# Patient Record
Sex: Male | Born: 1990 | Race: Black or African American | Hispanic: No | Smoking: Current some day smoker
Health system: Southern US, Community
[De-identification: ages and names within clinical notes are randomized; demographics above are authoritative.]

## PROBLEM LIST (undated history)

## (undated) DIAGNOSIS — R002 Palpitations: Secondary | ICD-10-CM

## (undated) DIAGNOSIS — U071 COVID-19: Secondary | ICD-10-CM

## (undated) DIAGNOSIS — K589 Irritable bowel syndrome without diarrhea: Secondary | ICD-10-CM

## (undated) HISTORY — DX: Palpitations: R00.2

---

## 2013-06-11 ENCOUNTER — Emergency Department (HOSPITAL_COMMUNITY)
Admission: EM | Admit: 2013-06-11 | Discharge: 2013-06-11 | Disposition: A | Discharge status: Home / Self Care | Payer: BC Managed Care – PPO | Attending: Emergency Medicine | Admitting: Emergency Medicine

## 2013-06-11 ENCOUNTER — Encounter (HOSPITAL_COMMUNITY): Payer: Self-pay | Admitting: *Deleted

## 2013-06-11 DIAGNOSIS — K589 Irritable bowel syndrome without diarrhea: Secondary | ICD-10-CM | POA: Insufficient documentation

## 2013-06-11 DIAGNOSIS — R51 Headache: Secondary | ICD-10-CM | POA: Insufficient documentation

## 2013-06-11 DIAGNOSIS — F17200 Nicotine dependence, unspecified, uncomplicated: Secondary | ICD-10-CM | POA: Insufficient documentation

## 2013-06-11 DIAGNOSIS — R519 Headache, unspecified: Secondary | ICD-10-CM

## 2013-06-11 DIAGNOSIS — R110 Nausea: Secondary | ICD-10-CM

## 2013-06-11 HISTORY — DX: Irritable bowel syndrome, unspecified: K58.9

## 2013-06-11 MED ORDER — ONDANSETRON 4 MG PO TBDP
4.0000 mg | ORAL_TABLET | Freq: Once | ORAL | Status: AC
  Administered 2013-06-11: 4 mg via ORAL
  Filled 2013-06-11: qty 1

## 2013-06-11 MED ORDER — ONDANSETRON 4 MG PO TBDP: 4.0000 mg | ORAL_TABLET | Freq: Three times a day (TID) | ORAL | Status: DC | PRN

## 2013-06-11 NOTE — ED Notes (Signed)
Pt states that he began to have a headache around 11pm tonight; pain is to the left temple area; pt has not taken any medications for headache; pt states he has never had a headache like this before.

## 2013-06-11 NOTE — ED Provider Notes (Signed)
  CSN: [id number]     Arrival date & time 06/11/13  0251 History     First MD Initiated Contact with Patient 06/11/13 [phone]     Chief Complaint  Patient presents with  . Headache   (Consider location/radiation/quality/duration/timing/severity/associated sxs/prior Treatment) HPI Comments: Patient states he has a history of migraine headaches.  Started with one around 11:00 tonight.  He has not taken any medication because he "did not have anything."  At this time.  His headache has, resolved, but he continues to have some nausea.  He, states he normally can take an antiemetic, which his primary care physician describes for him, but he doesn't recall the name.  He also has a history of IBS, and only takes medication as needed.  He has not reported any diarrhea vomiting or blood in his stool  Patient is a 22 y.o. male presenting with headaches. The history is provided by the patient.  Headache Pain location:  Generalized Radiates to:  Does not radiate Severity currently:  Unable to specify Severity at highest:  0/10 Onset quality:  Gradual Timing:  Unable to specify Progression:  Resolved Chronicity:  Recurrent Similar to prior headaches: yes   Relieved by:  None tried Associated symptoms: nausea   Associated symptoms: no diarrhea, no dizziness and no fever     Past Medical History  Diagnosis Date  . IBS (irritable bowel syndrome)    History reviewed. No pertinent past surgical history. No family history on file. History  Substance Use Topics  . Smoking status: Current Some Day Smoker    Types: Cigars  . Smokeless tobacco: Not on file  . Alcohol Use: No    Review of Systems  Constitutional: Negative for fever.  Respiratory: Negative for shortness of breath.   Gastrointestinal: Positive for nausea. Negative for diarrhea and blood in stool.  Skin: Negative for rash.  Neurological: Positive for headaches. Negative for dizziness.  All other systems reviewed and are  negative.    Allergies  Review of patient's allergies indicates no known allergies.  Home Medications   Current Outpatient Rx  Name  Route  Sig  Dispense  Refill  . ondansetron (ZOFRAN-ODT) 4 MG disintegrating tablet   Oral   Take 1 tablet (4 mg total) by mouth every 8 (eight) hours as needed for nausea.   20 tablet   0    BP 121/72  Pulse 57  Resp 18  Ht 5\' 9"  (1.753 m)  Wt 170 lb (77.111 kg)  BMI 25.09 kg/m2  SpO2 98% Physical Exam  Vitals reviewed. Constitutional: He is oriented to person, place, and time. He appears well-developed and well-nourished.  HENT:  Head: Normocephalic and atraumatic.  Mouth/Throat: Oropharynx is clear and moist.  Eyes: Pupils are equal, round, and reactive to light.  Neck: Normal range of motion.  Cardiovascular: Normal rate and regular rhythm.   Pulmonary/Chest: Effort normal and breath sounds normal.  Abdominal: Soft. Bowel sounds are normal. He exhibits no distension. There is no tenderness.  Musculoskeletal: Normal range of motion. He exhibits no tenderness.  Lymphadenopathy:    He has no cervical adenopathy.  Neurological: He is alert and oriented to person, place, and time.  Skin: Skin is warm. No rash noted.    ED Course   Procedures (including critical care time)  Labs Reviewed - No data to display No results found. 1. Headache   2. Nausea     MDM     Arman Filter, NP 06/13/13 [phone]

## 2013-06-14 NOTE — ED Provider Notes (Signed)
Medical screening examination/treatment/procedure(s) were performed by non-physician practitioner and as supervising physician I was immediately available for consultation/collaboration.    Tarynn Garling R Subrina Vecchiarelli, MD 06/14/13 0729 

## 2013-07-04 ENCOUNTER — Emergency Department (HOSPITAL_COMMUNITY): Payer: BC Managed Care – PPO

## 2013-07-04 ENCOUNTER — Encounter (HOSPITAL_COMMUNITY): Payer: Self-pay | Admitting: *Deleted

## 2013-07-04 ENCOUNTER — Emergency Department (HOSPITAL_COMMUNITY)
Admission: EM | Admit: 2013-07-04 | Discharge: 2013-07-04 | Disposition: A | Discharge status: Home / Self Care | Payer: BC Managed Care – PPO | Attending: Emergency Medicine | Admitting: Emergency Medicine

## 2013-07-04 DIAGNOSIS — Z8719 Personal history of other diseases of the digestive system: Secondary | ICD-10-CM | POA: Insufficient documentation

## 2013-07-04 DIAGNOSIS — R0602 Shortness of breath: Secondary | ICD-10-CM | POA: Insufficient documentation

## 2013-07-04 DIAGNOSIS — R002 Palpitations: Secondary | ICD-10-CM

## 2013-07-04 DIAGNOSIS — F17200 Nicotine dependence, unspecified, uncomplicated: Secondary | ICD-10-CM | POA: Insufficient documentation

## 2013-07-04 LAB — CBC
HCT: 40.2 % (ref 39.0–52.0)
Hemoglobin: 14.7 g/dL (ref 13.0–17.0)
MCH: 30.9 pg (ref 26.0–34.0)
MCHC: 36.6 g/dL — ABNORMAL HIGH (ref 30.0–36.0)
MCV: 84.6 fL (ref 78.0–100.0)

## 2013-07-04 LAB — BASIC METABOLIC PANEL
BUN: 12 mg/dL (ref 6–23)
Creatinine, Ser: 0.87 mg/dL (ref 0.50–1.35)
GFR calc non Af Amer: >90 mL/min (ref >90)
Glucose, Bld: 87 mg/dL (ref 70–99)
Potassium: 4 mEq/L (ref 3.5–5.1)

## 2013-07-04 NOTE — ED Notes (Signed)
Pt reports having irrergular heart beat, sts his heart at times beats really fast and then it slows down, reports having shortness of breath when that happens.

## 2013-07-04 NOTE — Progress Notes (Signed)
During 07/04/13 ED visit [facility] CM noted pt with coverage but no pcp listed [facility] CM spoke with pt on how to obtain an in network pcp with insurance coverage via the customer service number or web site  CM encouraged pt and discussed pt's responsibility to verify with pt's insurance carrier that any recommended medical provider offered by any emergency room or a hospital provider is within the carrier's network. The pt voiced understanding

## 2013-07-04 NOTE — ED Provider Notes (Signed)
Medical screening examination/treatment/procedure(s) were performed by non-physician practitioner and as supervising physician I was immediately available for consultation/collaboration.    Velma Agnes J. Maddix Kliewer, MD 07/04/13 2348 

## 2013-07-04 NOTE — ED Provider Notes (Signed)
CSN: [id number]     Arrival date & time 07/04/13  1408 History   First MD Initiated Contact with Patient 07/04/13 1502     Chief Complaint  Patient presents with  . Palpitations   (Consider location/radiation/quality/duration/timing/severity/associated sxs/prior Treatment) The history is provided by the patient and medical records.   Pt presents to the ED for intermittent palpitations x several weeks.  Pt states sx seem worse when lying down in bed, better with standing upright.  Notes some SOB when palpitations occur but no chest pain, dizziness, weakness, nausea, or vomiting.  Pt has no significant personal or family cardiac hx.  Pt is an occasional smoker, cigars.  No excessive EtOH use.  No cough, fevers, sweats, or chills.  Past Medical History  Diagnosis Date  . IBS (irritable bowel syndrome)    History reviewed. No pertinent past surgical history. History reviewed. No pertinent family history. History  Substance Use Topics  . Smoking status: Current Some Day Smoker    Types: Cigars  . Smokeless tobacco: Not on file  . Alcohol Use: No    Review of Systems  Cardiovascular: Positive for palpitations.  All other systems reviewed and are negative.    Allergies  Review of patient's allergies indicates no known allergies.  Home Medications   Current Outpatient Rx  Name  Route  Sig  Dispense  Refill  . ondansetron (ZOFRAN-ODT) 4 MG disintegrating tablet   Oral   Take 1 tablet (4 mg total) by mouth every 8 (eight) hours as needed for nausea.   20 tablet   0    BP 131/72  Pulse 74  Temp(Src) 98 F (36.7 C) (Oral)  Resp 18  SpO2 99%  Physical Exam  Nursing note and vitals reviewed. Constitutional: He is oriented to person, place, and time. He appears well-developed and well-nourished.  HENT:  Head: Normocephalic and atraumatic.  Mouth/Throat: Oropharynx is clear and moist.  Eyes: Conjunctivae and EOM are normal. Pupils are equal, round, and reactive to light.   Neck: Normal range of motion. Neck supple.  Cardiovascular: Normal rate, regular rhythm, normal heart sounds and normal pulses.   No murmur heard. Pulmonary/Chest: Effort normal and breath sounds normal.  Abdominal: Soft. Bowel sounds are normal. There is no tenderness. There is no guarding.  Musculoskeletal: Normal range of motion.  Neurological: He is alert and oriented to person, place, and time. He has normal strength. He displays no tremor. No cranial nerve deficit or sensory deficit. He displays no seizure activity. Gait normal.  Skin: Skin is warm and dry.  Psychiatric: He has a normal mood and affect.    ED Course  Procedures (including critical care time)   Date: 07/04/2013  Rate: 19  Rhythm: normal sinus rhythm  QRS Axis: normal  Intervals: normal  ST/T Wave abnormalities: nonspecific ST/T changes  Conduction Disutrbances:none  Narrative Interpretation: nonspecific ST elevations  Old EKG Reviewed: none available   Labs Review Labs Reviewed  CBC - Abnormal; Notable for the following:    MCHC 36.6 (*)    All other components within normal limits  BASIC METABOLIC PANEL  POCT I-STAT TROPONIN I   Imaging Review Dg Chest Port 1 View  07/04/2013   *RADIOLOGY REPORT*  Clinical Data: Chest palpitations and shortness of breath.  PORTABLE CHEST - 1 VIEW  Comparison: None.  Findings: Lungs are clear.  Heart size is normal.  No pneumothorax or pleural fluid.  IMPRESSION: Negative chest.   Original Report Authenticated By: Holley Dexter, M.D.  MDM   1. Palpitations    EKG NSR, no acute ischemic changes but some diffuse ST elevations possibly indicative of pericarditis-- discussed with Dr. Blinda Leatherwood, likely a normal variant given absence of chest pain.  Trop negative.  CXR clear.  Labs largely WNL.  Doubt ACS, PE, dissection, or other vascular collapse at this time.  Pt afebrile, non-toxic appearing, NAD, VS stable- ok for discharge.  Pt will FU with [facility] for  further evaluation, possibly holter monitor.  Discussed plan with pt, they agreed.  Return precautions advised.  Garlon Hatchet, PA-C 07/04/13 1640

## 2013-07-13 ENCOUNTER — Ambulatory Visit (INDEPENDENT_AMBULATORY_CARE_PROVIDER_SITE_OTHER): Payer: BC Managed Care – PPO | Admitting: Cardiology

## 2013-07-13 ENCOUNTER — Encounter: Payer: Self-pay | Admitting: Cardiology

## 2013-07-13 VITALS — BP 122/82 | HR 72 | Ht 69.1 in | Wt 176.8 lb

## 2013-07-13 DIAGNOSIS — R002 Palpitations: Secondary | ICD-10-CM

## 2013-07-13 LAB — TSH: TSH: 3.33 u[IU]/mL (ref 0.35–5.50)

## 2013-07-13 NOTE — Patient Instructions (Addendum)
Lab today  ( TSH )    Schedule 48 hour holter monitor     Your physician recommends that you schedule a follow-up appointment in: 2 weeks

## 2013-07-13 NOTE — Progress Notes (Signed)
Patient ID: Rande Roylance, male   DOB: May 11, 1991, 22 y.o.   MRN: [id number]   Patient Name: Dylan Douglas Date of Encounter: 07/13/2013  Primary Care Provider:  No primary provider on file. Primary Cardiologist:  Tobias Alexander, MD  Patient Profile  Palpitations  Problem List   Past Medical History  Diagnosis Date  . IBS (irritable bowel syndrome)    No past surgical history on file.  Allergies  No Known Allergies  HPI  22 year old previously healthy male who is coming for a follow up up after an ER visit for palpitations. The symptoms started about a month ago, they usually occur after he lays down to bed, or after awakening in the morning. Sometime they wake him up at night. He describes them as rapid heart beats followed by pauses, they might lasts from few beats to hours and usually start abruptly. They are associated with shortness of breath but not chest pain. No dizziness or syncope. The patient denies use of drugs, high-energy drinks or excessive use of alcohol.  Home Medications  Prior to Admission medications   Medication Sig Start Date End Date Taking? Authorizing Provider  Hyoscyamine Sulfate (HYOSYNE PO) Take by mouth.   Yes Historical Provider, MD  ondansetron (ZOFRAN-ODT) 4 MG disintegrating tablet Take 1 tablet (4 mg total) by mouth every 8 (eight) hours as needed for nausea. 06/11/13  Yes Arman Filter, NP    Family History  No family history on file. Breast cancer - mother. No cardiac history.  Social History  History   Social History  . Marital Status: Single    Spouse Name: N/A    Number of Children: N/A  . Years of Education: N/A   Occupational History  . Not on file.   Social History Main Topics  . Smoking status: Current Some Day Smoker    Types: Cigars  . Smokeless tobacco: Not on file  . Alcohol Use: No  . Drug Use: No  . Sexual Activity: Not on file   Other Topics Concern  . Not on file   Social History Narrative  . No narrative on  file     Review of Systems General:  No chills, fever, night sweats or weight changes.  Cardiovascular:  No chest pain, dyspnea on exertion, edema, orthopnea, paroxysmal nocturnal dyspnea. Dermatological: No rash, lesions/masses Respiratory: No cough, dyspnea Urologic: No hematuria, dysuria Abdominal:   No nausea, vomiting, diarrhea, bright red blood per rectum, melena, or hematemesis Neurologic:  No visual changes, wkns, changes in mental status. All other systems reviewed and are otherwise negative except as noted above.  Physical Exam  Blood pressure 122/82, pulse 72, height 5' 9.1" (1.755 m), weight 176 lb 12.8 oz (80.196 kg).  General: Pleasant, NAD Psych: Normal affect. Neuro: Alert and oriented X 3. Moves all extremities spontaneously. HEENT: Normal  Neck: Supple without bruits or JVD. Lungs:  Resp regular and unlabored, CTA. Heart: RRR no s3, s4, or murmurs. Abdomen: Soft, non-tender, non-distended, BS + x 4.  Extremities: No clubbing, cyanosis or edema. DP/PT/Radials 2+ and equal bilaterally.  Accessory Clinical Findings  ECG - SR, HR 71, PR , QRS 80 ms, QT , normal ECG  Assessment & Plan  A 22 year old male with palpitations that appears as frequent PVCs followed by compensatory pauses. Associated shortness of breath is concerning. We will order 48-Holter monitor, order TSH and follow up in 2 weeks.     Tobias Alexander, Rexene Edison, MD 07/13/2013, 3:10 PM

## 2013-07-16 ENCOUNTER — Encounter: Payer: Self-pay | Admitting: *Deleted

## 2013-07-16 ENCOUNTER — Encounter (INDEPENDENT_AMBULATORY_CARE_PROVIDER_SITE_OTHER): Payer: BC Managed Care – PPO

## 2013-07-16 DIAGNOSIS — R002 Palpitations: Secondary | ICD-10-CM

## 2013-07-16 NOTE — Progress Notes (Signed)
Patient ID: Dylan Douglas, male   DOB: 1990/12/07, 22 y.o.   MRN: [id number] E-Cardio 48 Hour Holter Monitor applied to patient.

## 2013-07-23 ENCOUNTER — Telehealth: Payer: Self-pay

## 2013-07-23 NOTE — Telephone Encounter (Signed)
Per Dr Delton See, Pts holter monitor results were normal. According to his diary the pt had 2 episodes but was in SR both times.   Cannot reach pt with phone number provided-VM not set up. Will continue to call.

## 2013-07-24 ENCOUNTER — Ambulatory Visit: Payer: BC Managed Care – PPO | Admitting: Cardiology

## 2013-07-25 ENCOUNTER — Encounter: Payer: Self-pay | Admitting: *Deleted

## 2013-07-30 NOTE — Telephone Encounter (Signed)
**Note De-Identified Dylan Douglas Obfuscation** Cannot reach pt at phone number provided, will mail result letter to pts address.

## 2013-07-31 ENCOUNTER — Encounter: Payer: Self-pay | Admitting: Cardiology

## 2013-07-31 ENCOUNTER — Ambulatory Visit (INDEPENDENT_AMBULATORY_CARE_PROVIDER_SITE_OTHER): Payer: BC Managed Care – PPO | Admitting: Cardiology

## 2013-07-31 VITALS — BP 132/84 | HR 61 | Ht 69.1 in | Wt 183.0 lb

## 2013-07-31 DIAGNOSIS — R002 Palpitations: Secondary | ICD-10-CM

## 2013-07-31 NOTE — Progress Notes (Signed)
Patient ID: Dylan Douglas, male   DOB: [birth year], 22 y.o.   MRN: [id number]    Patient Name: Dylan Douglas Date of Encounter: 07/31/2013  Primary Care Provider:  No primary provider on file. Primary Cardiologist:  Tobias Alexander, MD   Patient Profile  Palpitation  Problem List   Past Medical History  Diagnosis Date  . IBS (irritable bowel syndrome)   . Palpitations    No past surgical history on file.  Allergies  No Known Allergies  HPI  22 year old male with palpitations associated with SOB. 48-Hour Holter showed SR only, but the patient states that he didn't have symptoms during those two days. Two more episodes since then.  Home Medications  Prior to Admission medications   Medication Sig Start Date End Date Taking? Authorizing Provider  Hyoscyamine Sulfate (HYOSYNE PO) Take by mouth.   Yes Historical Provider, MD  ondansetron (ZOFRAN-ODT) 4 MG disintegrating tablet Take 1 tablet (4 mg total) by mouth every 8 (eight) hours as needed for nausea. 06/11/13  Yes Arman Filter, NP    Review of Systems  Palpitations, with SOB, occasional chest pain.  All other systems reviewed and are otherwise negative except as noted above.  Physical Exam  Blood pressure 132/84, pulse 61, height 5' 9.1" (1.755 m), weight 183 lb (83.008 kg).  General: Pleasant, NAD Psych: Normal affect. Neuro: Alert and oriented X 3. Moves all extremities spontaneously. HEENT: Normal  Neck: Supple without bruits or JVD. Lungs:  Resp regular and unlabored, CTA. Heart: RRR no s3, s4, or murmurs. Abdomen: Soft, non-tender, non-distended, BS + x 4.  Extremities: No clubbing, cyanosis or edema. DP/PT/Radials 2+ and equal bilaterally.  Accessory Clinical Findings  TSH 3.33  Assessment & Plan  22 year old male with palpitations that were not recorded on 48Hour Holter monitor. We will order 1 month event monitor. TSH normal.  Follow up in 1 month.  Tobias Alexander, Rexene Edison, MD 07/31/2013, 9:09 AM

## 2013-07-31 NOTE — Patient Instructions (Addendum)
Schedule event monitor     Your physician recommends that you schedule a follow-up appointment in: after monitor

## 2013-08-03 ENCOUNTER — Telehealth: Payer: Self-pay | Admitting: *Deleted

## 2013-08-03 ENCOUNTER — Encounter (INDEPENDENT_AMBULATORY_CARE_PROVIDER_SITE_OTHER): Payer: BC Managed Care – PPO

## 2013-08-03 DIAGNOSIS — R002 Palpitations: Secondary | ICD-10-CM

## 2013-08-03 NOTE — Telephone Encounter (Signed)
30 day event monitor placed on Pt 08/03/13 TK

## 2013-08-15 ENCOUNTER — Encounter: Payer: Self-pay | Admitting: Cardiology

## 2013-08-15 ENCOUNTER — Ambulatory Visit (INDEPENDENT_AMBULATORY_CARE_PROVIDER_SITE_OTHER): Payer: BC Managed Care – PPO | Admitting: Cardiology

## 2013-08-15 VITALS — BP 144/86 | HR 70 | Ht 69.0 in | Wt 179.0 lb

## 2013-08-15 DIAGNOSIS — R002 Palpitations: Secondary | ICD-10-CM

## 2013-08-15 NOTE — Patient Instructions (Signed)
Your physician recommends that you schedule a follow-up appointment in: we will contact you with results of Event monitor

## 2013-08-15 NOTE — Progress Notes (Signed)
Patient ID: Dylan Douglas, male   DOB: 07-Aug-1991, 22 y.o.   MRN: [id number]  Patient Name: Dylan Douglas Date of Encounter: 08/15/2013  Primary Care Provider:  No primary provider on file. Primary Cardiologist:  Tobias Alexander, MD   Patient Profile  Palpitation  Problem List   Past Medical History  Diagnosis Date  . IBS (irritable bowel syndrome)   . Palpitations    No past surgical history on file.  Allergies  No Known Allergies  HPI  22 year old male with palpitations associated with SOB. 48-Hour Holter showed SR only, but the patient states that he didn't have symptoms during those two days. Two more episodes since then.  This is a two week follow up visit. The patient had 3 palpitations since he started wearing E-cardio monitor. He states those episodes were not as strong as some of those in the past. No chest pain or dizziness associated with these episodes.  Home Medications  Prior to Admission medications   Medication Sig Start Date End Date Taking? Authorizing Provider  Hyoscyamine Sulfate (HYOSYNE PO) Take by mouth.   Yes Historical Provider, MD  ondansetron (ZOFRAN-ODT) 4 MG disintegrating tablet Take 1 tablet (4 mg total) by mouth every 8 (eight) hours as needed for nausea. 06/11/13  Yes Arman Filter, NP    Review of Systems  Palpitations, with SOB, occasional chest pain.  All other systems reviewed and are otherwise negative except as noted above.  Physical Exam  Blood pressure 144/86, pulse 70, height 5\' 9"  (1.753 m), weight 179 lb (81.194 kg).  General: Pleasant, NAD Psych: Normal affect. Neuro: Alert and oriented X 3. Moves all extremities spontaneously. HEENT: Normal  Neck: Supple without bruits or JVD. Lungs:  Resp regular and unlabored, CTA. Heart: RRR no s3, s4, or murmurs. Abdomen: Soft, non-tender, non-distended, BS + x 4.  Extremities: No clubbing, cyanosis or edema. DP/PT/Radials 2+ and equal bilaterally.  Accessory Clinical  Findings  TSH 3.33  Assessment & Plan  22 year old male with palpitations that were not recorded on 48Hour Holter monitor. Normal TSH. We reviewed 3 episodes of palpitations that the patient initiated on his E-cardio monitor. There was normal sinus rhythm during all of them, no tachycardia, no PACs or PVCs. We will continue E-cardio monitoring to finish the entire month. We will call the patient with the results. There is no need for further intervention or therapy at this point.  Tobias Alexander, Rexene Edison, MD 08/15/2013, 1:08 PM

## 2015-04-23 ENCOUNTER — Ambulatory Visit (INDEPENDENT_AMBULATORY_CARE_PROVIDER_SITE_OTHER): Payer: BLUE CROSS/BLUE SHIELD | Admitting: Urgent Care

## 2015-04-23 ENCOUNTER — Ambulatory Visit (INDEPENDENT_AMBULATORY_CARE_PROVIDER_SITE_OTHER): Payer: BLUE CROSS/BLUE SHIELD

## 2015-04-23 VITALS — BP 118/72 | HR 67 | Temp 98.8°F | Resp 16 | Ht 68.5 in | Wt 176.0 lb

## 2015-04-23 DIAGNOSIS — S6991XA Unspecified injury of right wrist, hand and finger(s), initial encounter: Secondary | ICD-10-CM

## 2015-04-23 DIAGNOSIS — S63612A Unspecified sprain of right middle finger, initial encounter: Secondary | ICD-10-CM

## 2015-04-23 NOTE — Patient Instructions (Signed)
Finger Sprain  A finger sprain is a tear in one of the strong, fibrous tissues that connect the bones (ligaments) in your finger. The severity of the sprain depends on how much of the ligament is torn. The tear can be either partial or complete.  CAUSES   Often, sprains are a result of a fall or accident. If you extend your hands to catch an object or to protect yourself, the force of the impact causes the fibers of your ligament to stretch too much. This excess tension causes the fibers of your ligament to tear.  SYMPTOMS   You may have some loss of motion in your finger. Other symptoms include:   Bruising.   Tenderness.   Swelling.  DIAGNOSIS   In order to diagnose finger sprain, your caregiver will physically examine your finger or thumb to determine how torn the ligament is. Your caregiver may also suggest an X-ray exam of your finger to make sure no bones are broken.  TREATMENT   If your ligament is only partially torn, treatment usually involves keeping the finger in a fixed position (immobilization) for a short period. To do this, your caregiver will apply a bandage, cast, or splint to keep your finger from moving until it heals. For a partially torn ligament, the healing process usually takes 2 to 3 weeks.  If your ligament is completely torn, you may need surgery to reconnect the ligament to the bone. After surgery a cast or splint will be applied and will need to stay on your finger or thumb for 4 to 6 weeks while your ligament heals.  HOME CARE INSTRUCTIONS   Keep your injured finger elevated, when possible, to decrease swelling.   To ease pain and swelling, apply ice to your joint twice a day, for 2 to 3 days:   Put ice in a plastic bag.   Place a towel between your skin and the bag.   Leave the ice on for 15 minutes.   Only take over-the-counter or prescription medicine for pain as directed by your caregiver.   Do not wear rings on your injured finger.   Do not leave your finger unprotected  until pain and stiffness go away (usually 3 to 4 weeks).   Do not allow your cast or splint to get wet. Cover your cast or splint with a plastic bag when you shower or bathe. Do not swim.   Your caregiver may suggest special exercises for you to do during your recovery to prevent or limit permanent stiffness.  SEEK IMMEDIATE MEDICAL CARE IF:   Your cast or splint becomes damaged.   Your pain becomes worse rather than better.  MAKE SURE YOU:   Understand these instructions.   Will watch your condition.   Will get help right away if you are not doing well or get worse.  Document Released: 12/02/2004 Document Revised: 01/17/2012 Document Reviewed: 06/28/2011  Exit[employer] Patient Information 2015 [employer]. This information is not intended to replace advice given to you by your health care provider. Make sure you discuss any questions you have with your health care provider.

## 2015-04-23 NOTE — Progress Notes (Signed)
    MRN: [id number] DOB: 06-29-1991  Subjective:   Dylan Douglas is a 24 y.o. male presenting for chief complaint of Hand Pain  Reports five-day history of right middle finger injury. Patient states that he went bowling last Friday and ended up dropping the ball with his hand inside the bowling ball. He has since felt a deep pain elicited with activity including grasping and carrying things, worse at work, works at [employer]. Also admits some swelling of his right middle finger. He has been using ibuprofen daily to help with pain and has only had some relief. He denies decreased range of motion, numbness, tingling, bruising, bony deformity. Denies any other aggravating or relieving factors, no other questions or concerns.  Dylan Douglas currently has no medications in their medication list. He has No Known Allergies.  Dylan Douglas  has a past medical history of IBS (irritable bowel syndrome) and Palpitations. Also  has no past surgical history on file.  ROS As in subjective.  Objective:   Vitals: BP 118/72 mmHg  Pulse 67  Temp(Src) 98.8 F (37.1 C) (Oral)  Resp 16  Ht 5' 8.5" (1.74 m)  Wt 176 lb (79.833 kg)  BMI 26.37 kg/m2  SpO2 98%  Physical Exam  Constitutional: He is oriented to person, place, and time. He appears well-developed and well-nourished.  Cardiovascular: Normal rate.   Pulmonary/Chest: Effort normal.  Musculoskeletal:       Right hand: He exhibits swelling (trace edema at DIP of 3rd finger). He exhibits normal range of motion, no tenderness, no bony tenderness, normal capillary refill, no deformity and no laceration. Normal sensation noted. Normal strength noted.  Neurological: He is alert and oriented to person, place, and time.  Skin: Skin is warm and dry. No rash noted. No erythema. No pallor.   [facility] reading (PRIMARY) by  Dr. Milus Glazier and PA-Tiffannie Sloss. 3rd right finger: normal.  Assessment and Plan :   1. Injury of middle finger, right, initial encounter 2. Sprain of  third finger of right hand, initial encounter - Stable, recommended buddy tape system for one to 2 weeks, NSAID for pain and inflammation. Ice after work. Provided patient with temporary work restrictions. Patient is to followup as needed.  Wallis Bamberg, PA-C [facility] [phone] 04/23/2015 4:18 PM

## 2015-05-27 ENCOUNTER — Ambulatory Visit (INDEPENDENT_AMBULATORY_CARE_PROVIDER_SITE_OTHER): Payer: BLUE CROSS/BLUE SHIELD | Admitting: Emergency Medicine

## 2015-05-27 VITALS — BP 120/70 | HR 71 | Temp 98.3°F | Resp 18 | Ht 68.0 in | Wt 173.5 lb

## 2015-05-27 DIAGNOSIS — S338XXA Sprain of other parts of lumbar spine and pelvis, initial encounter: Secondary | ICD-10-CM

## 2015-05-27 DIAGNOSIS — K589 Irritable bowel syndrome without diarrhea: Secondary | ICD-10-CM

## 2015-05-27 DIAGNOSIS — S39012A Strain of muscle, fascia and tendon of lower back, initial encounter: Secondary | ICD-10-CM

## 2015-05-27 MED ORDER — NAPROXEN SODIUM 550 MG PO TABS: 550.0000 mg | ORAL_TABLET | Freq: Two times a day (BID) | ORAL | Status: DC

## 2015-05-27 MED ORDER — POLYETHYLENE GLYCOL 3350 17 GM/SCOOP PO POWD: 17.0000 g | Freq: Every day | ORAL | Status: DC

## 2015-05-27 MED ORDER — DICYCLOMINE HCL 20 MG PO TABS: 20.0000 mg | ORAL_TABLET | Freq: Four times a day (QID) | ORAL | Status: DC

## 2015-05-27 MED ORDER — CYCLOBENZAPRINE HCL 10 MG PO TABS: 10.0000 mg | ORAL_TABLET | Freq: Three times a day (TID) | ORAL | Status: DC | PRN

## 2015-05-27 NOTE — Patient Instructions (Signed)
Diet and Irritable Bowel Syndrome  No cure has been found for irritable bowel syndrome (IBS). Many options are available to treat the symptoms. Your caregiver will give you the best treatments available for your symptoms. He or she will also encourage you to manage stress and to make changes to your diet. You need to work with your caregiver and Registered Dietician to find the best combination of medicine, diet, counseling, and support to control your symptoms. The following are some diet suggestions. FOODS THAT MAKE IBS WORSE  Fatty foods, such as French fries.  Milk products, such as cheese or ice cream.  Chocolate.  Alcohol.  Caffeine (found in coffee and some sodas).  Carbonated drinks, such as soda. If certain foods cause symptoms, you should eat less of them or stop eating them. FOOD JOURNAL   Keep a journal of the foods that seem to cause distress. Write down:  What you are eating during the day and when.  What problems you are having after eating.  When the symptoms occur in relation to your meals.  What foods always make you feel badly.  Take your notes with you to your caregiver to see if you should stop eating certain foods. FOODS THAT MAKE IBS BETTER Fiber reduces IBS symptoms, especially constipation, because it makes stools soft, bulky, and easier to pass. Fiber is found in bran, bread, cereal, beans, fruit, and vegetables. Examples of foods with fiber include:  Apples.  Peaches.  Pears.  Berries.  Figs.  Broccoli, raw.  Cabbage.  Carrots.  Raw peas.  Kidney beans.  Lima beans.  Whole-grain bread.  Whole-grain cereal. Add foods with fiber to your diet a little at a time. This will let your body get used to them. Too much fiber at once might cause gas and swelling of your abdomen. This can trigger symptoms in a person with IBS. Caregivers usually recommend a diet with enough fiber to produce soft, painless bowel movements. High fiber diets may  cause gas and bloating. However, these symptoms often go away within a few weeks, as your body adjusts. In many cases, dietary fiber may lessen IBS symptoms, particularly constipation. However, it may not help pain or diarrhea. High fiber diets keep the colon mildly enlarged (distended) with the added fiber. This may help prevent spasms in the colon. Some forms of fiber also keep water in the stool, thereby preventing hard stools that are difficult to pass.  Besides telling you to eat more foods with fiber, your caregiver may also tell you to get more fiber by taking a fiber pill or drinking water mixed with a special high fiber powder. An example of this is a natural fiber laxative containing psyllium seed.  TIPS  Large meals can cause cramping and diarrhea in people with IBS. If this happens to you, try eating 4 or 5 small meals a day, or try eating less at each of your usual 3 meals. It may also help if your meals are low in fat and high in carbohydrates. Examples of carbohydrates are pasta, rice, whole-grain breads and cereals, fruits, and vegetables.  If dairy products cause your symptoms to flare up, you can try eating less of those foods. You might be able to handle yogurt better than other dairy products, because it contains bacteria that helps with digestion. Dairy products are an important source of calcium and other nutrients. If you need to avoid dairy products, be sure to talk with a Registered Dietitian about getting these nutrients   through other food sources.  Drink enough water and fluids to keep your urine clear or pale yellow. This is important, especially if you have diarrhea. FOR MORE INFORMATION  International Foundation for Functional Gastrointestinal Disorders: www.iffgd.org  National Digestive Diseases Information Clearinghouse: digestive.niddk.nih.gov Document Released: 01/15/2004 Document Revised: 01/17/2012 Document Reviewed: 01/25/2014 [employer] Patient Information 2015  [employer]. This information is not intended to replace advice given to you by your health care provider. Make sure you discuss any questions you have with your health care provider.  

## 2015-05-27 NOTE — Progress Notes (Signed)
Subjective:  Patient ID: Dylan Douglas, male    DOB: 1991-03-04  Age: 24 y.o. MRN: [id number]  CC: Abdominal Pain and Depression   HPI Dylan Douglas presents  for evaluation of abdominal pain. He has IBS by history. He is not taking any medication. He moved here from [place] and has no local physician. He's not been seen by gastroenterologist while he says. Abdominal pain since this morning. His crampy in nature and mid abdominal crosses abdomen. He has no blood mucus or pus in stool. Has no diarrhea. His IBS is primarily related to constipation. He had some nausea this morning and was unable to eat today but one meal he's had scant vomiting no fever chills  History Dylan Douglas has a past medical history of IBS (irritable bowel syndrome) and Palpitations.   He has no past surgical history on file.   His  family history includes Breast cancer in his mother; Cancer in his mother.  He   reports that he has quit smoking. His smoking use included Cigars. He does not have any smokeless tobacco history on file. He reports that he drinks alcohol. He reports that he does not use illicit drugs.  No outpatient prescriptions prior to visit.   No facility-administered medications prior to visit.    History   Social History  . Marital Status: Single    Spouse Name: N/A  . Number of Children: N/A  . Years of Education: N/A   Social History Main Topics  . Smoking status: Former Smoker    Types: Cigars  . Smokeless tobacco: Not on file  . Alcohol Use: 0.0 oz/week    0 Standard drinks or equivalent per week     Comment: Occasionally  . Drug Use: No  . Sexual Activity: Not on file   Other Topics Concern  . None   Social History Narrative     Review of Systems  Constitutional: Negative for fever, chills and appetite change.  HENT: Negative for congestion, ear pain, postnasal drip, sinus pressure and sore throat.   Eyes: Negative for pain and redness.  Respiratory: Negative for  cough, shortness of breath and wheezing.   Cardiovascular: Negative for leg swelling.  Gastrointestinal: Negative for nausea, vomiting, abdominal pain, diarrhea, constipation and blood in stool.  Endocrine: Negative for polyuria.  Genitourinary: Negative for dysuria, urgency, frequency and flank pain.  Musculoskeletal: Negative for gait problem.  Skin: Negative for rash.  Neurological: Negative for weakness and headaches.  Psychiatric/Behavioral: Negative for confusion and decreased concentration. The patient is not nervous/anxious.     Objective:  BP 120/70 mmHg  Pulse 71  Temp(Src) 98.3 F (36.8 C) (Oral)  Resp 18  Ht  (1.727 m)  Wt 173 lb 8 oz (78.699 kg)  BMI 26.39 kg/m2  SpO2 98%  Physical Exam  Constitutional: He is oriented to person, place, and time. He appears well-developed and well-nourished.  HENT:  Head: Normocephalic and atraumatic.  Eyes: Conjunctivae are normal. Pupils are equal, round, and reactive to light.  Pulmonary/Chest: Effort normal.  Abdominal: Soft. Bowel sounds are normal. He exhibits no distension. There is tenderness. There is no rebound and no guarding.  Musculoskeletal: He exhibits no edema.  Neurological: He is alert and oriented to person, place, and time.  Skin: Skin is dry.  Psychiatric: He has a normal mood and affect. His behavior is normal. Thought content normal.      Assessment & Plan:   Dylan Douglas was seen today for abdominal pain  and depression.  Diagnoses and all orders for this visit:  IBS (irritable bowel syndrome) Orders: -     Ambulatory referral to Gastroenterology  Lumbosacral strain, initial encounter  Other orders -     dicyclomine (BENTYL) 20 MG tablet; Take 1 tablet (20 mg total) by mouth every 6 (six) hours. -     polyethylene glycol powder (GLYCOLAX/MIRALAX) powder; Take 17 g by mouth daily. -     naproxen sodium (ANAPROX DS) 550 MG tablet; Take 1 tablet (550 mg total) by mouth 2 (two) times daily with a  meal. -     cyclobenzaprine (FLEXERIL) 10 MG tablet; Take 1 tablet (10 mg total) by mouth 3 (three) times daily as needed for muscle spasms.   I am having Dylan Douglas start on dicyclomine, polyethylene glycol powder, naproxen sodium, and cyclobenzaprine. I am also having him maintain his Loratadine and ondansetron.  Meds ordered this encounter  Medications  . Loratadine 10 MG CAPS    Sig: Take 10 mg by mouth daily as needed.  . ondansetron (ZOFRAN-ODT) 4 MG disintegrating tablet    Sig: Take 4 mg by mouth every 8 (eight) hours as needed for nausea or vomiting.  . dicyclomine (BENTYL) 20 MG tablet    Sig: Take 1 tablet (20 mg total) by mouth every 6 (six) hours.    Dispense:  100 tablet    Refill:  1  . polyethylene glycol powder (GLYCOLAX/MIRALAX) powder    Sig: Take 17 g by mouth daily.    Dispense:  3350 g    Refill:  1  . naproxen sodium (ANAPROX DS) 550 MG tablet    Sig: Take 1 tablet (550 mg total) by mouth 2 (two) times daily with a meal.    Dispense:  40 tablet    Refill:  0  . cyclobenzaprine (FLEXERIL) 10 MG tablet    Sig: Take 1 tablet (10 mg total) by mouth 3 (three) times daily as needed for muscle spasms.    Dispense:  45 tablet    Refill:  0   He was put on Bentyl and MiraLAX and will follow-up with a gastroenterologist who has been referred to  Appropriate red flag conditions were discussed with the patient as well as actions that should be taken.  Patient expressed his understanding.  Follow-up: Return if symptoms worsen or fail to improve.  Dylan Douglas, Mckinsey Keagle S, MD

## 2015-06-19 ENCOUNTER — Ambulatory Visit (INDEPENDENT_AMBULATORY_CARE_PROVIDER_SITE_OTHER): Payer: Self-pay | Admitting: Emergency Medicine

## 2015-06-19 VITALS — BP 118/70 | HR 73 | Temp 98.4°F | Resp 18 | Ht 69.25 in | Wt 175.0 lb

## 2015-06-19 DIAGNOSIS — K589 Irritable bowel syndrome without diarrhea: Secondary | ICD-10-CM

## 2015-06-19 MED ORDER — IMIPRAMINE HCL 25 MG PO TABS: 25.0000 mg | ORAL_TABLET | Freq: Every day | ORAL | Status: DC

## 2015-06-19 NOTE — Patient Instructions (Signed)
Diet and Irritable Bowel Syndrome  No cure has been found for irritable bowel syndrome (IBS). Many options are available to treat the symptoms. Your caregiver will give you the best treatments available for your symptoms. He or she will also encourage you to manage stress and to make changes to your diet. You need to work with your caregiver and Registered Dietician to find the best combination of medicine, diet, counseling, and support to control your symptoms. The following are some diet suggestions. FOODS THAT MAKE IBS WORSE  Fatty foods, such as French fries.  Milk products, such as cheese or ice cream.  Chocolate.  Alcohol.  Caffeine (found in coffee and some sodas).  Carbonated drinks, such as soda. If certain foods cause symptoms, you should eat less of them or stop eating them. FOOD JOURNAL   Keep a journal of the foods that seem to cause distress. Write down:  What you are eating during the day and when.  What problems you are having after eating.  When the symptoms occur in relation to your meals.  What foods always make you feel badly.  Take your notes with you to your caregiver to see if you should stop eating certain foods. FOODS THAT MAKE IBS BETTER Fiber reduces IBS symptoms, especially constipation, because it makes stools soft, bulky, and easier to pass. Fiber is found in bran, bread, cereal, beans, fruit, and vegetables. Examples of foods with fiber include:  Apples.  Peaches.  Pears.  Berries.  Figs.  Broccoli, raw.  Cabbage.  Carrots.  Raw peas.  Kidney beans.  Lima beans.  Whole-grain bread.  Whole-grain cereal. Add foods with fiber to your diet a little at a time. This will let your body get used to them. Too much fiber at once might cause gas and swelling of your abdomen. This can trigger symptoms in a person with IBS. Caregivers usually recommend a diet with enough fiber to produce soft, painless bowel movements. High fiber diets may  cause gas and bloating. However, these symptoms often go away within a few weeks, as your body adjusts. In many cases, dietary fiber may lessen IBS symptoms, particularly constipation. However, it may not help pain or diarrhea. High fiber diets keep the colon mildly enlarged (distended) with the added fiber. This may help prevent spasms in the colon. Some forms of fiber also keep water in the stool, thereby preventing hard stools that are difficult to pass.  Besides telling you to eat more foods with fiber, your caregiver may also tell you to get more fiber by taking a fiber pill or drinking water mixed with a special high fiber powder. An example of this is a natural fiber laxative containing psyllium seed.  TIPS  Large meals can cause cramping and diarrhea in people with IBS. If this happens to you, try eating 4 or 5 small meals a day, or try eating less at each of your usual 3 meals. It may also help if your meals are low in fat and high in carbohydrates. Examples of carbohydrates are pasta, rice, whole-grain breads and cereals, fruits, and vegetables.  If dairy products cause your symptoms to flare up, you can try eating less of those foods. You might be able to handle yogurt better than other dairy products, because it contains bacteria that helps with digestion. Dairy products are an important source of calcium and other nutrients. If you need to avoid dairy products, be sure to talk with a Registered Dietitian about getting these nutrients   through other food sources.  Drink enough water and fluids to keep your urine clear or pale yellow. This is important, especially if you have diarrhea. FOR MORE INFORMATION  International Foundation for Functional Gastrointestinal Disorders: www.iffgd.org  National Digestive Diseases Information Clearinghouse: digestive.niddk.nih.gov Document Released: 01/15/2004 Document Revised: 01/17/2012 Document Reviewed: 01/25/2014 [employer] Patient Information 2015  [employer]. This information is not intended to replace advice given to you by your health care provider. Make sure you discuss any questions you have with your health care provider.  

## 2015-06-19 NOTE — Progress Notes (Signed)
Subjective:  Patient ID: Dylan Douglas, male    DOB: May 21, 1991  Age: 24 y.o. MRN: [id number]  CC: Abdominal Pain   HPI Dylan Douglas presents   He has a history of irritable bowel syndrome. He was seen previously in July and put on Bentyl and  MiraLAX. For  Symptoms of constipation related to IBS. Now is coming to the office stating that he is not using hear any medication. He is now having 3 loose looser stools a day. He has no blood in stools or black stools nausea vomiting or other complaints of fever or chills. He'll have insurance in a month.  History Dylan Douglas has a past medical history of IBS (irritable bowel syndrome) and Palpitations.   He has no past surgical history on file.   His  family history includes Breast cancer in his mother; Cancer in his mother.  He   reports that he has quit smoking. His smoking use included Cigars. He does not have any smokeless tobacco history on file. He reports that he drinks alcohol. He reports that he does not use illicit drugs.  Outpatient Prescriptions Prior to Visit  Medication Sig Dispense Refill  . cyclobenzaprine (FLEXERIL) 10 MG tablet Take 1 tablet (10 mg total) by mouth 3 (three) times daily as needed for muscle spasms. 45 tablet 0  . dicyclomine (BENTYL) 20 MG tablet Take 1 tablet (20 mg total) by mouth every 6 (six) hours. 100 tablet 1  . Loratadine 10 MG CAPS Take 10 mg by mouth daily as needed.    . naproxen sodium (ANAPROX DS) 550 MG tablet Take 1 tablet (550 mg total) by mouth 2 (two) times daily with a meal. 40 tablet 0  . ondansetron (ZOFRAN-ODT) 4 MG disintegrating tablet Take 4 mg by mouth every 8 (eight) hours as needed for nausea or vomiting.    . polyethylene glycol powder (GLYCOLAX/MIRALAX) powder Take 17 g by mouth daily. (Patient not taking: Reported on 06/19/2015) 3350 g 1   No facility-administered medications prior to visit.    Social History   Social History  . Marital Status: Single    Spouse Name: N/A    . Number of Children: N/A  . Years of Education: N/A   Social History Main Topics  . Smoking status: Former Smoker    Types: Cigars  . Smokeless tobacco: None  . Alcohol Use: 0.0 oz/week    0 Standard drinks or equivalent per week     Comment: Occasionally  . Drug Use: No  . Sexual Activity: Not Asked   Other Topics Concern  . None   Social History Narrative     Review of Systems  Constitutional: Negative for fever, chills and appetite change.  HENT: Negative for congestion, ear pain, postnasal drip, sinus pressure and sore throat.   Eyes: Negative for pain and redness.  Respiratory: Negative for cough, shortness of breath and wheezing.   Cardiovascular: Negative for leg swelling.  Gastrointestinal: Negative for nausea, vomiting, abdominal pain, diarrhea, constipation and blood in stool.  Endocrine: Negative for polyuria.  Genitourinary: Negative for dysuria, urgency, frequency and flank pain.  Musculoskeletal: Negative for gait problem.  Skin: Negative for rash.  Neurological: Negative for weakness and headaches.  Psychiatric/Behavioral: Negative for confusion and decreased concentration. The patient is not nervous/anxious.     Objective:  BP 118/70 mmHg  Pulse 73  Temp(Src) 98.4 F (36.9 C) (Oral)  Resp 18  Ht 5' 9.25" (1.759 m)  Wt 175 lb (79.379  kg)  BMI 25.66 kg/m2  SpO2 98%  Physical Exam  Constitutional: He is oriented to person, place, and time. He appears well-developed and well-nourished. No distress.  HENT:  Head: Normocephalic and atraumatic.  Right Ear: External ear normal.  Left Ear: External ear normal.  Nose: Nose normal.  Eyes: Conjunctivae and EOM are normal. Pupils are equal, round, and reactive to light. No scleral icterus.  Neck: Normal range of motion. Neck supple. No tracheal deviation present.  Cardiovascular: Normal rate, regular rhythm and normal heart sounds.   Pulmonary/Chest: Effort normal. No respiratory distress. He has no  wheezes. He has no rales.  Abdominal: He exhibits no mass. There is no tenderness. There is no rebound and no guarding.  Musculoskeletal: He exhibits no edema.  Lymphadenopathy:    He has no cervical adenopathy.  Neurological: He is alert and oriented to person, place, and time. Coordination normal.  Skin: Skin is warm and dry. No rash noted.  Psychiatric: He has a normal mood and affect. His behavior is normal.      Assessment & Plan:   Dylan Douglas was seen today for abdominal pain.  Diagnoses and all orders for this visit:  IBS (irritable bowel syndrome) -     Ambulatory referral to Gastroenterology  Other orders -     imipramine (TOFRANIL) 25 MG tablet; Take 1 tablet (25 mg total) by mouth at bedtime.   I am having Dylan Douglas start on imipramine. I am also having him maintain his Loratadine, ondansetron, dicyclomine, polyethylene glycol powder, naproxen sodium, and cyclobenzaprine.  Meds ordered this encounter  Medications  . imipramine (TOFRANIL) 25 MG tablet    Sig: Take 1 tablet (25 mg total) by mouth at bedtime.    Dispense:  30 tablet    Refill:  1    Appropriate red flag conditions were discussed with the patient as well as actions that should be taken.  Patient expressed his understanding.  Follow-up: Return if symptoms worsen or fail to improve.  Carmelina Dane, MD

## 2015-07-04 ENCOUNTER — Encounter: Payer: Self-pay | Admitting: Emergency Medicine

## 2015-07-27 ENCOUNTER — Ambulatory Visit (INDEPENDENT_AMBULATORY_CARE_PROVIDER_SITE_OTHER): Payer: Self-pay | Admitting: Emergency Medicine

## 2015-07-27 VITALS — BP 126/72 | HR 103 | Temp 100.2°F | Resp 18 | Ht 69.0 in | Wt 172.0 lb

## 2015-07-27 DIAGNOSIS — J039 Acute tonsillitis, unspecified: Secondary | ICD-10-CM

## 2015-07-27 MED ORDER — PENICILLIN V POTASSIUM 500 MG PO TABS: 500.0000 mg | ORAL_TABLET | Freq: Four times a day (QID) | ORAL | Status: DC

## 2015-07-27 MED ORDER — HYDROCODONE-ACETAMINOPHEN 5-325 MG PO TABS: 1.0000 | ORAL_TABLET | ORAL | Status: DC | PRN

## 2015-07-27 NOTE — Progress Notes (Signed)
Subjective:  Patient ID: Dylan Douglas, male    DOB: 11/03/1991  Age: 24 y.o. MRN: [id number]  CC: Sore Throat; Fatigue; and Nausea   HPI Dylan Douglas presents  with a sore throat and fever for the last day and a half. He has no nausea vomiting no cough or coryza. No wheezing or shortness of breath. No rash. No improvement pain with over-the-counter medication. Has no ill contacts  History Dylan Douglas has a past medical history of IBS (irritable bowel syndrome) and Palpitations.   He has no past surgical history on file.   His  family history includes Breast cancer in his mother; Cancer in his mother.  He   reports that he has quit smoking. His smoking use included Cigars. He does not have any smokeless tobacco history on file. He reports that he drinks alcohol. He reports that he does not use illicit drugs.  Outpatient Prescriptions Prior to Visit  Medication Sig Dispense Refill  . Loratadine 10 MG CAPS Take 10 mg by mouth daily as needed.    . cyclobenzaprine (FLEXERIL) 10 MG tablet Take 1 tablet (10 mg total) by mouth 3 (three) times daily as needed for muscle spasms. (Patient not taking: Reported on 07/27/2015) 45 tablet 0  . dicyclomine (BENTYL) 20 MG tablet Take 1 tablet (20 mg total) by mouth every 6 (six) hours. (Patient not taking: Reported on 07/27/2015) 100 tablet 1  . imipramine (TOFRANIL) 25 MG tablet Take 1 tablet (25 mg total) by mouth at bedtime. (Patient not taking: Reported on 07/27/2015) 30 tablet 1  . naproxen sodium (ANAPROX DS) 550 MG tablet Take 1 tablet (550 mg total) by mouth 2 (two) times daily with a meal. (Patient not taking: Reported on 07/27/2015) 40 tablet 0  . ondansetron (ZOFRAN-ODT) 4 MG disintegrating tablet Take 4 mg by mouth every 8 (eight) hours as needed for nausea or vomiting.    . polyethylene glycol powder (GLYCOLAX/MIRALAX) powder Take 17 g by mouth daily. (Patient not taking: Reported on 06/19/2015) 3350 g 1   No facility-administered medications  prior to visit.    Social History   Social History  . Marital Status: Single    Spouse Name: N/A  . Number of Children: N/A  . Years of Education: N/A   Social History Main Topics  . Smoking status: Former Smoker    Types: Cigars  . Smokeless tobacco: None  . Alcohol Use: 0.0 oz/week    0 Standard drinks or equivalent per week     Comment: Occasionally  . Drug Use: No  . Sexual Activity: Not Asked   Other Topics Concern  . None   Social History Narrative     Review of Systems  Constitutional: Negative for fever, chills and appetite change.  HENT: Positive for sore throat. Negative for congestion, ear pain, postnasal drip and sinus pressure.   Eyes: Negative for pain and redness.  Respiratory: Negative for cough, shortness of breath and wheezing.   Cardiovascular: Negative for leg swelling.  Gastrointestinal: Negative for nausea, vomiting, abdominal pain, diarrhea, constipation and blood in stool.  Endocrine: Negative for polyuria.  Genitourinary: Negative for dysuria, urgency, frequency and flank pain.  Musculoskeletal: Negative for gait problem.  Skin: Negative for rash.  Neurological: Negative for weakness and headaches.  Psychiatric/Behavioral: Negative for confusion and decreased concentration. The patient is not nervous/anxious.     Objective:  BP 126/72 mmHg  Pulse 103  Temp(Src) 100.2 F (37.9 C) (Oral)  Resp 18  Ht   (1.753 m)  Wt 172 lb (78.019 kg)  BMI 25.39 kg/m2  SpO2 98%  Physical Exam  Constitutional: He is oriented to person, place, and time. He appears well-developed and well-nourished. No distress.  HENT:  Head: Normocephalic and atraumatic.  Right Ear: External ear normal.  Left Ear: External ear normal.  Nose: Nose normal.  Mouth/Throat: Oropharyngeal exudate present. No tonsillar abscesses.  Eyes: Conjunctivae and EOM are normal. Pupils are equal, round, and reactive to light. No scleral icterus.  Neck: Normal range of motion.  Neck supple. No tracheal deviation present.  Cardiovascular: Normal rate, regular rhythm and normal heart sounds.   Pulmonary/Chest: Effort normal. No respiratory distress. He has no wheezes. He has no rales.  Abdominal: He exhibits no mass. There is no tenderness. There is no rebound and no guarding.  Musculoskeletal: He exhibits no edema.  Lymphadenopathy:    He has no cervical adenopathy.  Neurological: He is alert and oriented to person, place, and time. Coordination normal.  Skin: Skin is warm and dry. No rash noted.  Psychiatric: He has a normal mood and affect. His behavior is normal.      Assessment & Plan:   Dylan Douglas was seen today for sore throat, fatigue and nausea.  Diagnoses and all orders for this visit:  Acute tonsillitis  Other orders -     penicillin v potassium (VEETID) 500 MG tablet; Take 1 tablet (500 mg total) by mouth 4 (four) times daily. -     HYDROcodone-acetaminophen (NORCO) 5-325 MG per tablet; Take 1-2 tablets by mouth every 4 (four) hours as needed.   I am having Dylan Douglas start on penicillin v potassium and HYDROcodone-acetaminophen. I am also having him maintain his Loratadine, ondansetron, dicyclomine, polyethylene glycol powder, naproxen sodium, cyclobenzaprine, and imipramine.  Meds ordered this encounter  Medications  . penicillin v potassium (VEETID) 500 MG tablet    Sig: Take 1 tablet (500 mg total) by mouth 4 (four) times daily.    Dispense:  40 tablet    Refill:  0  . HYDROcodone-acetaminophen (NORCO) 5-325 MG per tablet    Sig: Take 1-2 tablets by mouth every 4 (four) hours as needed.    Dispense:  30 tablet    Refill:  0    Appropriate red flag conditions were discussed with the patient as well as actions that should be taken.  Patient expressed his understanding.  Follow-up: Return if symptoms worsen or fail to improve.  Carmelina Dane, MD

## 2015-07-27 NOTE — Patient Instructions (Signed)

## 2015-08-15 ENCOUNTER — Telehealth: Payer: Self-pay | Admitting: Emergency Medicine

## 2015-08-15 NOTE — Telephone Encounter (Signed)
Patient states that his throat is still sore. He states that he does not have the money to come in for OV.

## 2015-08-18 MED ORDER — AMOXICILLIN 875 MG PO TABS
875.0000 mg | ORAL_TABLET | Freq: Two times a day (BID) | ORAL | Status: DC
Start: 2015-08-18 — End: 2016-02-07

## 2015-08-18 NOTE — Telephone Encounter (Signed)
Any suggestions for pt? 

## 2015-08-18 NOTE — Telephone Encounter (Signed)
Spoke with patient and will send a prescription for amoxicillin to cover for strep throat. Counseled on potential for risks and adverse effects. Patient is unable to come in to office because of financial burden. I recommended patient return to clinic in 4-5 days if he has no improvement with amoxicillin. Patient verbalized understanding.

## 2016-02-05 ENCOUNTER — Emergency Department (HOSPITAL_COMMUNITY): Payer: Managed Care, Other (non HMO)

## 2016-02-05 ENCOUNTER — Emergency Department (HOSPITAL_COMMUNITY)
Admission: EM | Admit: 2016-02-05 | Discharge: 2016-02-05 | Disposition: A | Discharge status: Home / Self Care | Payer: Managed Care, Other (non HMO) | Attending: Emergency Medicine | Admitting: Emergency Medicine

## 2016-02-05 ENCOUNTER — Encounter (HOSPITAL_COMMUNITY): Payer: Self-pay

## 2016-02-05 DIAGNOSIS — B9789 Other viral agents as the cause of diseases classified elsewhere: Secondary | ICD-10-CM

## 2016-02-05 DIAGNOSIS — K589 Irritable bowel syndrome without diarrhea: Secondary | ICD-10-CM | POA: Diagnosis not present

## 2016-02-05 DIAGNOSIS — R1033 Periumbilical pain: Secondary | ICD-10-CM | POA: Diagnosis not present

## 2016-02-05 DIAGNOSIS — R112 Nausea with vomiting, unspecified: Secondary | ICD-10-CM | POA: Insufficient documentation

## 2016-02-05 DIAGNOSIS — R05 Cough: Secondary | ICD-10-CM | POA: Diagnosis present

## 2016-02-05 DIAGNOSIS — R1032 Left lower quadrant pain: Secondary | ICD-10-CM | POA: Insufficient documentation

## 2016-02-05 DIAGNOSIS — Z792 Long term (current) use of antibiotics: Secondary | ICD-10-CM | POA: Diagnosis not present

## 2016-02-05 DIAGNOSIS — R197 Diarrhea, unspecified: Secondary | ICD-10-CM | POA: Insufficient documentation

## 2016-02-05 DIAGNOSIS — J069 Acute upper respiratory infection, unspecified: Secondary | ICD-10-CM

## 2016-02-05 DIAGNOSIS — Z87891 Personal history of nicotine dependence: Secondary | ICD-10-CM | POA: Diagnosis not present

## 2016-02-05 LAB — CBC WITH DIFFERENTIAL/PLATELET
Basophils Absolute: 0 10*3/uL (ref 0.0–0.1)
Basophils Relative: 0 %
EOS PCT: 1 %
Eosinophils Absolute: 0.1 10*3/uL (ref 0.0–0.7)
HEMATOCRIT: 44 % (ref 39.0–52.0)
Hemoglobin: 15.9 g/dL (ref 13.0–17.0)
Lymphocytes Relative: 13 %
Lymphs Abs: 1.6 10*3/uL (ref 0.7–4.0)
MCH: 30.6 pg (ref 26.0–34.0)
MCHC: 36.1 g/dL — AB (ref 30.0–36.0)
MCV: 84.6 fL (ref 78.0–100.0)
MONOS PCT: 8 %
Monocytes Absolute: 1 10*3/uL (ref 0.1–1.0)
NEUTROS ABS: 9.3 10*3/uL — AB (ref 1.7–7.7)
Neutrophils Relative %: 78 %
Platelets: 192 10*3/uL (ref 150–400)
RBC: 5.2 MIL/uL (ref 4.22–5.81)
RDW: 13 % (ref 11.5–15.5)
WBC: 12 10*3/uL — ABNORMAL HIGH (ref 4.0–10.5)

## 2016-02-05 LAB — BASIC METABOLIC PANEL
ANION GAP: 10 (ref 5–15)
BUN: 13 mg/dL (ref 6–20)
CALCIUM: 9.5 mg/dL (ref 8.9–10.3)
CO2: 25 mmol/L (ref 22–32)
CREATININE: 1.13 mg/dL (ref 0.61–1.24)
Chloride: 103 mmol/L (ref 101–111)
GFR calc Af Amer: >60 mL/min (ref >60)
GFR calc non Af Amer: >60 mL/min (ref >60)
GLUCOSE: 100 mg/dL — AB (ref 65–99)
Potassium: 3.8 mmol/L (ref 3.5–5.1)
Sodium: 138 mmol/L (ref 135–145)

## 2016-02-05 MED ORDER — ONDANSETRON HCL 4 MG/2ML IJ SOLN
4.0000 mg | Freq: Once | INTRAMUSCULAR | Status: AC
  Administered 2016-02-05: 4 mg via INTRAVENOUS
  Filled 2016-02-05: qty 2

## 2016-02-05 MED ORDER — ACETAMINOPHEN 325 MG PO TABS
650.0000 mg | ORAL_TABLET | Freq: Once | ORAL | Status: AC
  Administered 2016-02-05: 650 mg via ORAL

## 2016-02-05 MED ORDER — GUAIFENESIN-CODEINE 100-10 MG/5ML PO SOLN: 5.0000 mL | Freq: Four times a day (QID) | ORAL | Status: DC | PRN

## 2016-02-05 MED ORDER — ACETAMINOPHEN 325 MG PO TABS
ORAL_TABLET | ORAL | Status: AC
  Filled 2016-02-05: qty 2

## 2016-02-05 MED ORDER — SODIUM CHLORIDE 0.9 % IV BOLUS (SEPSIS)
1000.0000 mL | Freq: Once | INTRAVENOUS | Status: AC
  Administered 2016-02-05: 1000 mL via INTRAVENOUS

## 2016-02-05 MED ORDER — ONDANSETRON 4 MG PO TBDP: ORAL_TABLET | ORAL | Status: DC

## 2016-02-05 NOTE — Discharge Instructions (Signed)
Upper Respiratory Infection, Adult °Most upper respiratory infections (URIs) are a viral infection of the air passages leading to the lungs. A URI affects the nose, throat, and upper air passages. The most common type of URI is nasopharyngitis and is typically referred to as "the common cold." °URIs run their course and usually go away on their own. Most of the time, a URI does not require medical attention, but sometimes a bacterial infection in the upper airways can follow a viral infection. This is called a secondary infection. Sinus and middle ear infections are common types of secondary upper respiratory infections. °Bacterial pneumonia can also complicate a URI. A URI can worsen asthma and chronic obstructive pulmonary disease (COPD). Sometimes, these complications can require emergency medical care and may be life threatening.  °CAUSES °Almost all URIs are caused by viruses. A virus is a type of germ and can spread from one person to another.  °RISKS FACTORS °You may be at risk for a URI if:  °· You smoke.   °· You have chronic heart or lung disease. °· You have a weakened defense (immune) system.   °· You are very young or very old.   °· You have nasal allergies or asthma. °· You work in crowded or poorly ventilated areas. °· You work in health care facilities or schools. °SIGNS AND SYMPTOMS  °Symptoms typically develop 2-3 days after you come in contact with a cold virus. Most viral URIs last 7-10 days. However, viral URIs from the influenza virus (flu virus) can last 14-18 days and are typically more severe. Symptoms may include:  °· Runny or stuffy (congested) nose.   °· Sneezing.   °· Cough.   °· Sore throat.   °· Headache.   °· Fatigue.   °· Fever.   °· Loss of appetite.   °· Pain in your forehead, behind your eyes, and over your cheekbones (sinus pain). °· Muscle aches.   °DIAGNOSIS  °Your health care provider may diagnose a URI by: °· Physical exam. °· Tests to check that your symptoms are not due to  another condition such as: °· Strep throat. °· Sinusitis. °· Pneumonia. °· Asthma. °TREATMENT  °A URI goes away on its own with time. It cannot be cured with medicines, but medicines may be prescribed or recommended to relieve symptoms. Medicines may help: °· Reduce your fever. °· Reduce your cough. °· Relieve nasal congestion. °HOME CARE INSTRUCTIONS  °· Take medicines only as directed by your health care provider.   °· Gargle warm saltwater or take cough drops to comfort your throat as directed by your health care provider. °· Use a warm mist humidifier or inhale steam from a shower to increase air moisture. This may make it easier to breathe. °· Drink enough fluid to keep your urine clear or pale yellow.   °· Eat soups and other clear broths and maintain good nutrition.   °· Rest as needed.   °· Return to work when your temperature has returned to normal or as your health care provider advises. You may need to stay home longer to avoid infecting others. You can also use a face mask and careful hand washing to prevent spread of the virus. °· Increase the usage of your inhaler if you have asthma.   °· Do not use any tobacco products, including cigarettes, chewing tobacco, or electronic cigarettes. If you need help quitting, ask your health care provider. °PREVENTION  °The best way to protect yourself from getting a cold is to practice good hygiene.  °· Avoid oral or hand contact with people with cold   symptoms.   °· Wash your hands often if contact occurs.   °There is no clear evidence that vitamin C, vitamin E, echinacea, or exercise reduces the chance of developing a cold. However, it is always recommended to get plenty of rest, exercise, and practice good nutrition.  °SEEK MEDICAL CARE IF:  °· You are getting worse rather than better.   °· Your symptoms are not controlled by medicine.   °· You have chills. °· You have worsening shortness of breath. °· You have brown or red mucus. °· You have yellow or brown nasal  discharge. °· You have pain in your face, especially when you bend forward. °· You have a fever. °· You have swollen neck glands. °· You have pain while swallowing. °· You have white areas in the back of your throat. °SEEK IMMEDIATE MEDICAL CARE IF:  °· You have severe or persistent: °¨ Headache. °¨ Ear pain. °¨ Sinus pain. °¨ Chest pain. °· You have chronic lung disease and any of the following: °¨ Wheezing. °¨ Prolonged cough. °¨ Coughing up blood. °¨ A change in your usual mucus. °· You have a stiff neck. °· You have changes in your: °¨ Vision. °¨ Hearing. °¨ Thinking. °¨ Mood. °MAKE SURE YOU:  °· Understand these instructions. °· Will watch your condition. °· Will get help right away if you are not doing well or get worse. °  °This information is not intended to replace advice given to you by your health care provider. Make sure you discuss any questions you have with your health care provider. °  °Document Released: 04/20/2001 Document Revised: 03/11/2015 Document Reviewed: 01/30/2014 °Elsevier Interactive Patient Education ©2016 [employer] ° °Cough, Adult °Coughing is a reflex that clears your throat and your airways. Coughing helps to heal and protect your lungs. It is normal to cough occasionally, but a cough that happens with other symptoms or lasts a long time may be a sign of a condition that needs treatment. A cough may last only 2-3 weeks (acute), or it may last longer than 8 weeks (chronic). °CAUSES °Coughing is commonly caused by: °· Breathing in substances that irritate your lungs. °· A viral or bacterial respiratory infection. °· Allergies. °· Asthma. °· Postnasal drip. °· Smoking. °· Acid backing up from the stomach into the esophagus (gastroesophageal reflux). °· Certain medicines. °· Chronic lung problems, including COPD (or rarely, lung cancer). °· Other medical conditions such as heart failure. °HOME CARE INSTRUCTIONS  °Pay attention to any changes in your symptoms. Take these actions to  help with your discomfort: °· Take medicines only as told by your health care provider. °¨ If you were prescribed an antibiotic medicine, take it as told by your health care provider. Do not stop taking the antibiotic even if you start to feel better. °¨ Talk with your health care provider before you take a cough suppressant medicine. °· Drink enough fluid to keep your urine clear or pale yellow. °· If the air is dry, use a cold steam vaporizer or humidifier in your bedroom or your home to help loosen secretions. °· Avoid anything that causes you to cough at work or at home. °· If your cough is worse at night, try sleeping in a semi-upright position. °· Avoid cigarette smoke. If you smoke, quit smoking. If you need help quitting, ask your health care provider. °· Avoid caffeine. °· Avoid alcohol. °· Rest as needed. °SEEK MEDICAL CARE IF:  °· You have new symptoms. °· You cough up pus. °· Your cough   does not get better after 2-3 weeks, or your cough gets worse. °· You cannot control your cough with suppressant medicines and you are losing sleep. °· You develop pain that is getting worse or pain that is not controlled with pain medicines. °· You have a fever. °· You have unexplained weight loss. °· You have night sweats. °SEEK IMMEDIATE MEDICAL CARE IF: °· You cough up blood. °· You have difficulty breathing. °· Your heartbeat is very fast. °  °This information is not intended to replace advice given to you by your health care provider. Make sure you discuss any questions you have with your health care provider. °  °Document Released: 04/23/2011 Document Revised: 07/16/2015 Document Reviewed: 01/01/2015 °Elsevier Interactive Patient Education ©2016 [employer] ° °

## 2016-02-05 NOTE — ED Notes (Signed)
Patient here with cough and congestion x 2 days, body aches with same

## 2016-02-05 NOTE — ED Provider Notes (Signed)
CSN: [id number]     Arrival date & time 02/05/16  1549 History  By signing my name below, I, Dylan Douglas, attest that this documentation has been prepared under the direction and in the presence of [name], NP. Electronically Signed: Tanda Douglas, ED Scribe. 02/05/2016. 4:51 PM.   Chief Complaint  Patient presents with  . Cough  . Nasal Congestion   Patient is a 25 y.o. male presenting with cough. The history is provided by the patient. No language interpreter was used.  Cough Cough characteristics:  Productive Sputum characteristics:  Unable to specify Severity:  Moderate Onset quality:  Gradual Duration:  2 days Timing:  Constant Progression:  Unchanged Chronicity:  New Smoker: no   Context: sick contacts   Relieved by:  None tried Worsened by:  Nothing tried Ineffective treatments:  None tried Associated symptoms: fever and myalgias      HPI Comments: Dylan Douglas is a 25 y.o. male who presents to the Emergency Department complaining of gradual onset, constant, productive cough x 1-2 days. Pt also complains of congestion, body aches, diarrhea, nausea, and vomiting. He states that he has been unable to keep anything down for the past 2 days. Pt is also having abdominal pain that he states it similar to previous IBS abdominal pain. He has had recent sick contact with family members who have flu like symptoms. Denies any other associated symptoms.   Past Medical History  Diagnosis Date  . IBS (irritable bowel syndrome)   . Palpitations    History reviewed. No pertinent past surgical history. Family History  Problem Relation Age of Onset  . Breast cancer Mother   . Cancer Mother    Social History  Substance Use Topics  . Smoking status: Former Smoker    Types: Cigars  . Smokeless tobacco: None  . Alcohol Use: 0.0 oz/week    0 Standard drinks or equivalent per week     Comment: Occasionally    Review of Systems  Constitutional: Positive for fever.  HENT:  Positive for congestion.   Respiratory: Positive for cough.   Gastrointestinal: Positive for nausea, vomiting, abdominal pain and diarrhea.  Musculoskeletal: Positive for myalgias.  All other systems reviewed and are negative.  Allergies  Review of patient's allergies indicates no known allergies.  Home Medications   Prior to Admission medications   Medication Sig Start Date End Date Taking? Authorizing Provider  amoxicillin (AMOXIL) 875 MG tablet Take 1 tablet (875 mg total) by mouth 2 (two) times daily. 08/18/15   Wallis Bamberg, PA-C  cyclobenzaprine (FLEXERIL) 10 MG tablet Take 1 tablet (10 mg total) by mouth 3 (three) times daily as needed for muscle spasms. Patient not taking: Reported on 07/27/2015 05/27/15   Carmelina Dane, MD  dicyclomine (BENTYL) 20 MG tablet Take 1 tablet (20 mg total) by mouth every 6 (six) hours. Patient not taking: Reported on 07/27/2015 05/27/15   Carmelina Dane, MD  HYDROcodone-acetaminophen [facility]) 5-325 MG per tablet Take 1-2 tablets by mouth every 4 (four) hours as needed. 07/27/15   Carmelina Dane, MD  imipramine (TOFRANIL) 25 MG tablet Take 1 tablet (25 mg total) by mouth at bedtime. Patient not taking: Reported on 07/27/2015 06/19/15   Carmelina Dane, MD  Loratadine 10 MG CAPS Take 10 mg by mouth daily as needed.    Historical Provider, MD  naproxen sodium (ANAPROX DS) 550 MG tablet Take 1 tablet (550 mg total) by mouth 2 (two) times daily with a meal.  Patient not taking: Reported on 07/27/2015 05/27/15 05/26/16  Carmelina DaneJeffery S Anderson, MD  ondansetron (ZOFRAN-ODT) 4 MG disintegrating tablet Take 4 mg by mouth every 8 (eight) hours as needed for nausea or vomiting.    Historical Provider, MD  penicillin v potassium (VEETID) 500 MG tablet Take 1 tablet (500 mg total) by mouth 4 (four) times daily. 07/27/15   Carmelina DaneJeffery S Anderson, MD  polyethylene glycol powder (GLYCOLAX/MIRALAX) powder Take 17 g by mouth daily. Patient not taking: Reported on 06/19/2015  05/27/15   Carmelina DaneJeffery S Anderson, MD   BP 125/103 mmHg  Pulse 120  Temp(Src) 101.7 F (38.7 C) (Oral)  Resp 20  Ht 5\' 10"  (1.778 m)  Wt 185 lb (83.915 kg)  BMI 26.54 kg/m2  SpO2 97%   Physical Exam  Constitutional: He is oriented to person, place, and time. He appears well-developed and well-nourished. No distress.  HENT:  Head: Normocephalic and atraumatic.  Eyes: Conjunctivae and EOM are normal.  Neck: Neck supple. No tracheal deviation present.  Cardiovascular: Normal rate and regular rhythm.   Pulmonary/Chest: Effort normal and breath sounds normal. No respiratory distress. He has no wheezes. He has no rhonchi. He has no rales.  Abdominal: There is tenderness in the periumbilical area and left lower quadrant.  Musculoskeletal: Normal range of motion.  Neurological: He is alert and oriented to person, place, and time.  Skin: Skin is warm and dry.  Psychiatric: He has a normal mood and affect. His behavior is normal.  Nursing note and vitals reviewed.   ED Course  Procedures (including critical care time)  DIAGNOSTIC STUDIES: Oxygen Saturation is 97% on RA, normal by my interpretation.    COORDINATION OF CARE: 4:49 PM-Discussed treatment plan which includes CXR with pt at bedside and pt agreed to plan.   Labs Review Labs Reviewed  CBC WITH DIFFERENTIAL/PLATELET  BASIC METABOLIC PANEL    Imaging Review Dg Chest 2 View  02/05/2016  CLINICAL DATA:  Cough, nausea and vomiting EXAM: CHEST  2 VIEW COMPARISON:  07/04/2013 chest radiograph. FINDINGS: Stable cardiomediastinal silhouette with normal heart size. No pneumothorax. No pleural effusion. Lungs appear clear, with no acute consolidative airspace disease and no pulmonary edema. IMPRESSION: No active cardiopulmonary disease. Electronically Signed   By: Delbert PhenixJason A Poff M.D.   On: 02/05/2016 16:54   I have personally reviewed and evaluated these images and lab results as part of my medical decision-making.   EKG  Interpretation None     Patient feels better after IV fluids and medication. MDM   Final diagnoses:  None  Pt symptoms consistent with URI. CXR negative for acute infiltrate. Pt will be discharged with symptomatic treatment.  Discussed return precautions.  Pt is hemodynamically stable & in NAD prior to discharge.  I personally performed the services described in this documentation, which was scribed in my presence. The recorded information has been reviewed and is accurate.      Felicie Mornavid Faylene Allerton, NP 02/05/16 1917  Laurence Spatesachel Morgan Little, MD 02/06/16 [phone]

## 2016-02-06 ENCOUNTER — Encounter (HOSPITAL_COMMUNITY): Payer: Self-pay | Admitting: Emergency Medicine

## 2016-02-06 ENCOUNTER — Emergency Department (HOSPITAL_COMMUNITY)
Admission: EM | Admit: 2016-02-06 | Discharge: 2016-02-07 | Disposition: A | Discharge status: Home / Self Care | Payer: Managed Care, Other (non HMO) | Attending: Emergency Medicine | Admitting: Emergency Medicine

## 2016-02-06 DIAGNOSIS — Z87891 Personal history of nicotine dependence: Secondary | ICD-10-CM | POA: Insufficient documentation

## 2016-02-06 DIAGNOSIS — J111 Influenza due to unidentified influenza virus with other respiratory manifestations: Secondary | ICD-10-CM | POA: Diagnosis not present

## 2016-02-06 DIAGNOSIS — R69 Illness, unspecified: Secondary | ICD-10-CM

## 2016-02-06 DIAGNOSIS — E860 Dehydration: Secondary | ICD-10-CM | POA: Insufficient documentation

## 2016-02-06 DIAGNOSIS — Z8719 Personal history of other diseases of the digestive system: Secondary | ICD-10-CM | POA: Insufficient documentation

## 2016-02-06 DIAGNOSIS — Z79899 Other long term (current) drug therapy: Secondary | ICD-10-CM | POA: Insufficient documentation

## 2016-02-06 DIAGNOSIS — R05 Cough: Secondary | ICD-10-CM | POA: Diagnosis present

## 2016-02-06 MED ORDER — KETOROLAC TROMETHAMINE 30 MG/ML IJ SOLN
30.0000 mg | Freq: Once | INTRAMUSCULAR | Status: AC
  Administered 2016-02-06: 30 mg via INTRAVENOUS
  Filled 2016-02-06: qty 1

## 2016-02-06 MED ORDER — METOCLOPRAMIDE HCL 10 MG PO TABS: 10.0000 mg | ORAL_TABLET | Freq: Three times a day (TID) | ORAL | Status: DC | PRN

## 2016-02-06 MED ORDER — SODIUM CHLORIDE 0.9 % IV BOLUS (SEPSIS)
1000.0000 mL | Freq: Once | INTRAVENOUS | Status: AC
  Administered 2016-02-06: 1000 mL via INTRAVENOUS

## 2016-02-06 MED ORDER — ALBUTEROL SULFATE HFA 108 (90 BASE) MCG/ACT IN AERS
2.0000 | INHALATION_SPRAY | Freq: Once | RESPIRATORY_TRACT | Status: AC
  Administered 2016-02-06: 2 via RESPIRATORY_TRACT
  Filled 2016-02-06: qty 6.7

## 2016-02-06 MED ORDER — METOCLOPRAMIDE HCL 5 MG/ML IJ SOLN
10.0000 mg | Freq: Once | INTRAMUSCULAR | Status: AC
  Administered 2016-02-06: 10 mg via INTRAVENOUS
  Filled 2016-02-06: qty 2

## 2016-02-06 NOTE — ED Provider Notes (Signed)
CSN: [id number]     Arrival date & time 02/06/16  2043 History   First MD Initiated Contact with Patient 02/06/16 2307     Chief Complaint  Patient presents with  . Cough  . Back Pain     (Consider location/radiation/quality/duration/timing/severity/associated sxs/prior Treatment) HPI Comments: 25 year old male with a history of a upper respiratory infection which has been going on for a couple of days including coughing, body aches, fevers. He also has some nausea and vomiting and is having difficulty holding down fluids. He was seen yesterday during which time he had a chest x-ray showing no signs of pneumonia. The patient states that over the last 24 hours he has had a persistent myalgia, fever and a cough, nothing seems to make this better or worse despite taking cough medications.  He also has some back pain when he coughs and some chest discomfort when he coughs. This is persistent over the last couple of days, nothing is changed. He has not had diarrhea, he has only urinated a couple of times and states that he feels dehydrated  Patient is a 25 y.o. male presenting with cough and back pain. The history is provided by the patient.  Cough Back Pain   Past Medical History  Diagnosis Date  . IBS (irritable bowel syndrome)   . Palpitations    History reviewed. No pertinent past surgical history. Family History  Problem Relation Age of Onset  . Breast cancer Mother   . Cancer Mother    Social History  Substance Use Topics  . Smoking status: Former Smoker    Types: Cigars  . Smokeless tobacco: None  . Alcohol Use: 0.0 oz/week    0 Standard drinks or equivalent per week     Comment: Occasionally    Review of Systems  Respiratory: Positive for cough.   Musculoskeletal: Positive for back pain.  All other systems reviewed and are negative.     Allergies  Review of patient's allergies indicates no known allergies.  Home Medications   Prior to Admission medications    Medication Sig Start Date End Date Taking? Authorizing Provider  amoxicillin (AMOXIL) 875 MG tablet Take 1 tablet (875 mg total) by mouth 2 (two) times daily. 08/18/15   Wallis BambergMario Mani, PA-C  cyclobenzaprine (FLEXERIL) 10 MG tablet Take 1 tablet (10 mg total) by mouth 3 (three) times daily as needed for muscle spasms. Patient not taking: Reported on 07/27/2015 05/27/15   Carmelina DaneJeffery S Anderson, MD  dicyclomine (BENTYL) 20 MG tablet Take 1 tablet (20 mg total) by mouth every 6 (six) hours. Patient not taking: Reported on 07/27/2015 05/27/15   Carmelina DaneJeffery S Anderson, MD  guaiFENesin-codeine 100-10 MG/5ML syrup Take 5 mLs by mouth every 6 (six) hours as needed for cough. 02/05/16   Felicie Mornavid Smith, NP  HYDROcodone-acetaminophen (NORCO) 5-325 MG per tablet Take 1-2 tablets by mouth every 4 (four) hours as needed. 07/27/15   Carmelina DaneJeffery S Anderson, MD  imipramine (TOFRANIL) 25 MG tablet Take 1 tablet (25 mg total) by mouth at bedtime. Patient not taking: Reported on 07/27/2015 06/19/15   Carmelina DaneJeffery S Anderson, MD  Loratadine 10 MG CAPS Take 10 mg by mouth daily as needed.    Historical Provider, MD  metoCLOPramide (REGLAN) 10 MG tablet Take 1 tablet (10 mg total) by mouth 3 (three) times daily as needed for nausea (headache / nausea). 02/06/16   Eber HongBrian Fintan Grater, MD  naproxen sodium (ANAPROX DS) 550 MG tablet Take 1 tablet (550 mg total) by mouth 2 (  two) times daily with a meal. Patient not taking: Reported on 07/27/2015 05/27/15 05/26/16  Carmelina Dane, MD  ondansetron (ZOFRAN ODT) 4 MG disintegrating tablet  ODT q6 hours prn nausea/vomit 02/05/16   [name], NP  penicillin v potassium (VEETID) 500 MG tablet Take 1 tablet (500 mg total) by mouth 4 (four) times daily. 07/27/15   Carmelina Dane, MD  polyethylene glycol powder (GLYCOLAX/MIRALAX) powder Take 17 g by mouth daily. Patient not taking: Reported on 06/19/2015 05/27/15   Carmelina Dane, MD   BP 136/72 mmHg  Pulse 97  Temp(Src) 100 F (37.8 C) (Oral)  Resp 20   Ht  (1.753 m)  Wt 86 lb (39.009 kg)  BMI 12.69 kg/m2  SpO2 94% Physical Exam  Constitutional: He appears well-developed and well-nourished. No distress.  HENT:  Head: Normocephalic and atraumatic.  Mouth/Throat: Oropharynx is clear and moist. No oropharyngeal exudate.  Mucous members are mildly dehydrated  Eyes: Conjunctivae and EOM are normal. Pupils are equal, round, and reactive to light. Right eye exhibits no discharge. Left eye exhibits no discharge. No scleral icterus.  Neck: Normal range of motion. Neck supple. No JVD present. No thyromegaly present.  Cardiovascular: Normal rate, regular rhythm, normal heart sounds and intact distal pulses.  Exam reveals no gallop and no friction rub.   No murmur heard. No tachycardia, strong pulses, no edema  Pulmonary/Chest: Effort normal and breath sounds normal. No respiratory distress. He has no wheezes. He has no rales.  Lungs are clear to auscultation, speaks in full sentences without distress or increased work of breathing  Abdominal: Soft. Bowel sounds are normal. He exhibits no distension and no mass. There is no tenderness.  No abdominal tenderness  Musculoskeletal: Normal range of motion. He exhibits no edema or tenderness.  Lymphadenopathy:    He has no cervical adenopathy.  Neurological: He is alert. Coordination normal.  Skin: Skin is warm and dry. No rash noted. No erythema.  Psychiatric: He has a normal mood and affect. His behavior is normal.  Nursing note and vitals reviewed.   ED Course  Procedures (including critical care time) Labs Review Labs Reviewed - No data to display  Imaging Review Dg Chest 2 View  02/05/2016  CLINICAL DATA:  Cough, nausea and vomiting EXAM: CHEST  2 VIEW COMPARISON:  07/04/2013 chest radiograph. FINDINGS: Stable cardiomediastinal silhouette with normal heart size. No pneumothorax. No pleural effusion. Lungs appear clear, with no acute consolidative airspace disease and no pulmonary edema.  IMPRESSION: No active cardiopulmonary disease. Electronically Signed   By: Delbert Phenix M.D.   On: 02/05/2016 16:54   I have personally reviewed and evaluated these images and lab results as part of my medical decision-making.    MDM   Final diagnoses:  Influenza-like illness    The patient is well-appearing overall though he does appear dehydrated from this illness. It sounds like he has a flulike illness, his chest x-ray was negative, today his vital signs are unremarkable except for a borderline fever. He will be given an anti-inflammatory, a metered-dose inhaler, and antidiabetic and IV fluids. Anticipate discharge. The patient is in total agreement with the plan.  Meds given in ED:  Medications  sodium chloride 0.9 % bolus 1,000 mL (not administered)  metoCLOPramide (REGLAN) injection 10 mg (not administered)  ketorolac (TORADOL) 30 MG/ML injection 30 mg (not administered)  albuterol (PROVENTIL HFA;VENTOLIN HFA) 108 (90 Base) MCG/ACT inhaler 2 puff (not administered)    New Prescriptions   METOCLOPRAMIDE (  REGLAN) 10 MG TABLET    Take 1 tablet (10 mg total) by mouth 3 (three) times daily as needed for nausea (headache / nausea).        Eber Hong, MD 02/06/16 [phone]

## 2016-02-06 NOTE — Discharge Instructions (Signed)
#  1 use the inhaler every 4 hours as needed for coughing  #2 ibuprofen or Tylenol, alternate every 4 hours as needed for fever or back pain  #3 continue the cough medication  Please obtain all of your results from medical records or have your doctors office obtain the results - share them with your doctor - you should be seen at your doctors office in the next 2 days. Call today to arrange your follow up. Take the medications as prescribed. Please review all of the medicines and only take them if you do not have an allergy to them. Please be aware that if you are taking birth control pills, taking other prescriptions, ESPECIALLY ANTIBIOTICS may make the birth control ineffective - if this is the case, either do not engage in sexual activity or use alternative methods of birth control such as condoms until you have finished the medicine and your family doctor says it is OK to restart them. If you are on a blood thinner such as COUMADIN, be aware that any other medicine that you take may cause the coumadin to either work too much, or not enough - you should have your coumadin level rechecked in next 7 days if this is the case.  ?  It is also a possibility that you have an allergic reaction to any of the medicines that you have been prescribed - Everybody reacts differently to medications and while MOST people have no trouble with most medicines, you may have a reaction such as nausea, vomiting, rash, swelling, shortness of breath. If this is the case, please stop taking the medicine immediately and contact your physician.  ?  You should return to the ER if you develop severe or worsening symptoms.

## 2016-02-06 NOTE — ED Notes (Signed)
Pt. reports persistent dry cough with generalized back pain worse when coughing and lying on bed , denies fever or chills , respirations unlabored , seen here yesterday prescribed with antiemetic  and antitussive with no relief.

## 2016-02-07 NOTE — ED Notes (Signed)
Pt stable, ambulatory, states understanding of discharge instructions 

## 2016-05-19 ENCOUNTER — Encounter (HOSPITAL_COMMUNITY): Payer: Self-pay

## 2016-05-19 ENCOUNTER — Emergency Department (HOSPITAL_COMMUNITY)
Admission: EM | Admit: 2016-05-19 | Discharge: 2016-05-19 | Disposition: A | Discharge status: Home / Self Care | Payer: Managed Care, Other (non HMO) | Attending: Emergency Medicine | Admitting: Emergency Medicine

## 2016-05-19 DIAGNOSIS — Z79899 Other long term (current) drug therapy: Secondary | ICD-10-CM | POA: Diagnosis not present

## 2016-05-19 DIAGNOSIS — R1084 Generalized abdominal pain: Secondary | ICD-10-CM | POA: Insufficient documentation

## 2016-05-19 DIAGNOSIS — F17210 Nicotine dependence, cigarettes, uncomplicated: Secondary | ICD-10-CM | POA: Diagnosis not present

## 2016-05-19 DIAGNOSIS — R1033 Periumbilical pain: Secondary | ICD-10-CM | POA: Diagnosis present

## 2016-05-19 LAB — CBC
HCT: 44.7 % (ref 39.0–52.0)
Hemoglobin: 16 g/dL (ref 13.0–17.0)
MCH: 30.5 pg (ref 26.0–34.0)
MCHC: 35.8 g/dL (ref 30.0–36.0)
MCV: 85.3 fL (ref 78.0–100.0)
PLATELETS: 217 10*3/uL (ref 150–400)
RBC: 5.24 MIL/uL (ref 4.22–5.81)
RDW: 13.1 % (ref 11.5–15.5)
WBC: 11.6 10*3/uL — ABNORMAL HIGH (ref 4.0–10.5)

## 2016-05-19 LAB — COMPREHENSIVE METABOLIC PANEL
ALK PHOS: 42 U/L (ref 38–126)
ALT: 26 U/L (ref 17–63)
AST: 32 U/L (ref 15–41)
Albumin: 4.4 g/dL (ref 3.5–5.0)
Anion gap: 11 (ref 5–15)
BUN: 15 mg/dL (ref 6–20)
CALCIUM: 9.7 mg/dL (ref 8.9–10.3)
CO2: 26 mmol/L (ref 22–32)
CREATININE: 1.16 mg/dL (ref 0.61–1.24)
Chloride: 103 mmol/L (ref 101–111)
GLUCOSE: 115 mg/dL — AB (ref 65–99)
Potassium: 3.9 mmol/L (ref 3.5–5.1)
Sodium: 140 mmol/L (ref 135–145)
Total Bilirubin: 0.9 mg/dL (ref 0.3–1.2)
Total Protein: 6.9 g/dL (ref 6.5–8.1)

## 2016-05-19 LAB — URINALYSIS, ROUTINE W REFLEX MICROSCOPIC
GLUCOSE, UA: NEGATIVE mg/dL
HGB URINE DIPSTICK: NEGATIVE
Ketones, ur: 15 mg/dL — AB
Leukocytes, UA: NEGATIVE
Nitrite: NEGATIVE
PROTEIN: 30 mg/dL — AB
SPECIFIC GRAVITY, URINE: 1.037 — AB (ref 1.005–1.030)
pH: 7 (ref 5.0–8.0)

## 2016-05-19 LAB — URINE MICROSCOPIC-ADD ON: WBC, UA: NONE SEEN WBC/hpf (ref 0–5)

## 2016-05-19 LAB — ETHANOL: Alcohol, Ethyl (B): <5 mg/dL (ref <5)

## 2016-05-19 LAB — LIPASE, BLOOD: Lipase: 19 U/L (ref 11–51)

## 2016-05-19 MED ORDER — DICYCLOMINE HCL 20 MG PO TABS: 20.0000 mg | ORAL_TABLET | Freq: Two times a day (BID) | ORAL | Status: DC

## 2016-05-19 MED ORDER — PREDNISONE 20 MG PO TABS: 40.0000 mg | ORAL_TABLET | Freq: Every day | ORAL | Status: DC

## 2016-05-19 MED ORDER — METHYLPREDNISOLONE SODIUM SUCC 125 MG IJ SOLR
125.0000 mg | Freq: Once | INTRAMUSCULAR | Status: AC
  Administered 2016-05-19: 125 mg via INTRAVENOUS
  Filled 2016-05-19: qty 2

## 2016-05-19 MED ORDER — PROCHLORPERAZINE EDISYLATE 5 MG/ML IJ SOLN
10.0000 mg | Freq: Once | INTRAMUSCULAR | Status: AC
  Administered 2016-05-19: 10 mg via INTRAVENOUS
  Filled 2016-05-19: qty 2

## 2016-05-19 MED ORDER — KETOROLAC TROMETHAMINE 30 MG/ML IJ SOLN
10.0000 mg | Freq: Once | INTRAMUSCULAR | Status: AC
  Administered 2016-05-19: 9.9 mg via INTRAVENOUS
  Filled 2016-05-19: qty 1

## 2016-05-19 MED ORDER — ONDANSETRON HCL 4 MG/2ML IJ SOLN
4.0000 mg | Freq: Once | INTRAMUSCULAR | Status: AC
  Administered 2016-05-19: 4 mg via INTRAVENOUS
  Filled 2016-05-19: qty 2

## 2016-05-19 MED ORDER — SODIUM CHLORIDE 0.9 % IV BOLUS (SEPSIS)
1000.0000 mL | Freq: Once | INTRAVENOUS | Status: AC
  Administered 2016-05-19: 1000 mL via INTRAVENOUS

## 2016-05-19 NOTE — ED Notes (Signed)
Patient here with generalized abdominal pain with vomiting that started early am. States that he has IBS and hasn't had medications for same in a while. Loose stools with same, pale on arrival

## 2016-05-19 NOTE — ED Provider Notes (Signed)
CSN: [id number]     Arrival date & time 05/19/16  1250 History   First MD Initiated Contact with Patient 05/19/16 1322     Chief Complaint  Patient presents with  . Abdominal Pain  . Emesis    HPI  Patient presents with concern of abdominal pain. Patient has a history of IBS, no recent exacerbations. This episode of pain began within the past 12 hours, since onset has been persistent, diffuse, uncomfortable, with focal pain about the periumbilical area. Patient typically has some loose stool, and this has been persistent. However, the patient has had multiple episodes of vomiting today, persistent nausea, anorexia. No inability to tolerate by mouth intake. Patient also complains of diffuse headache. Patient has a typical combination of headache and abdominal pain during either migraine or IBS exacerbations.   Past Medical History  Diagnosis Date  . IBS (irritable bowel syndrome)   . Palpitations    History reviewed. No pertinent past surgical history. Family History  Problem Relation Age of Onset  . Breast cancer Mother   . Cancer Mother    Social History  Substance Use Topics  . Smoking status: Current Some Day Smoker    Types: Cigars, Cigarettes  . Smokeless tobacco: None  . Alcohol Use: 0.0 oz/week    0 Standard drinks or equivalent per week     Comment: Occasionally    Review of Systems  Constitutional:       Per HPI, otherwise negative  HENT:       Per HPI, otherwise negative  Respiratory:       Per HPI, otherwise negative  Cardiovascular:       Per HPI, otherwise negative  Gastrointestinal: Positive for nausea, vomiting and abdominal pain.  Endocrine:       Negative aside from HPI  Genitourinary:       Neg aside from HPI   Musculoskeletal:       Per HPI, otherwise negative  Skin: Negative.   Neurological: Negative for syncope.      Allergies  Review of patient's allergies indicates no known allergies.  Home Medications   Prior to Admission  medications   Medication Sig Start Date End Date Taking? Authorizing Provider  guaiFENesin-codeine 100-10 MG/5ML syrup Take 5 mLs by mouth every 6 (six) hours as needed for cough. 02/05/16   Felicie Mornavid Smith, NP  Loratadine 10 MG CAPS Take 10 mg by mouth daily as needed (allergies).     Historical Provider, MD  metoCLOPramide (REGLAN) 10 MG tablet Take 1 tablet (10 mg total) by mouth 3 (three) times daily as needed for nausea (headache / nausea). 02/06/16   Eber HongBrian Miller, MD  ondansetron (ZOFRAN ODT) 4 MG disintegrating tablet 4mg  ODT q6 hours prn nausea/vomit 02/05/16   Felicie Mornavid Smith, NP   BP 117/65 mmHg  Pulse 75  Temp(Src) 97.8 F (36.6 C) (Oral)  Resp 16  Ht 5\' 10"  (1.778 m)  Wt 185 lb (83.915 kg)  BMI 26.54 kg/m2  SpO2 93% Physical Exam  Constitutional: He is oriented to person, place, and time. He appears well-developed. No distress.  HENT:  Head: Normocephalic and atraumatic.  Eyes: Conjunctivae and EOM are normal.  Cardiovascular: Normal rate and regular rhythm.   Pulmonary/Chest: Effort normal. No stridor. No respiratory distress.  Abdominal: He exhibits no distension.    Musculoskeletal: He exhibits no edema.  Neurological: He is alert and oriented to person, place, and time. He displays no atrophy and no tremor. He exhibits normal muscle tone. He  displays no seizure activity. Coordination normal.  Skin: Skin is warm and dry.  Psychiatric: He has a normal mood and affect.  Nursing note and vitals reviewed.   ED Course  Procedures (including critical care time) Labs Review Labs Reviewed  COMPREHENSIVE METABOLIC PANEL - Abnormal; Notable for the following:    Glucose, Bld 115 (*)    All other components within normal limits  CBC - Abnormal; Notable for the following:    WBC 11.6 (*)    All other components within normal limits  URINALYSIS, ROUTINE W REFLEX MICROSCOPIC (NOT AT [facility]) - Abnormal; Notable for the following:    Color, Urine AMBER (*)    Specific Gravity, Urine  1.037 (*)    Bilirubin Urine SMALL (*)    Ketones, ur 15 (*)    Protein, ur 30 (*)    All other components within normal limits  URINE MICROSCOPIC-ADD ON - Abnormal; Notable for the following:    Squamous Epithelial / LPF 0-5 (*)    Bacteria, UA FEW (*)    All other components within normal limits  LIPASE, BLOOD  ETHANOL    Imaging Review No results found. I have personally reviewed and evaluated these images and lab results as part of my medical decision-making.   3:52 PM Symptoms resolved, patient states that he feels better.  MDM  Young male with IBS presents with ongoing abdominal pain, nausea, vomiting. Patient does have periumbilical pain, but his pain, nausea resolved, and absent fever, leukocytosis, there is low suspicion for acute new processes. Symptoms maybe secondary to IBS flare. Patient does not have local GI follow-up, was provided referral to clinics. Patient also started on a short course of steroids, analgesia  Gerhard Munch, MD 05/19/16 1553

## 2016-05-19 NOTE — ED Notes (Addendum)
Unable to provide urine specimen at triage 

## 2016-05-19 NOTE — Discharge Instructions (Signed)
As discussed, today's evaluation was largely reassuring. However, the [place] take all medication as directed, and be sure to schedule follow-up with one of our local gastroenterology clinics.  Return here for concerning changes in your condition.

## 2017-04-12 ENCOUNTER — Emergency Department (HOSPITAL_COMMUNITY)
Admission: EM | Admit: 2017-04-12 | Discharge: 2017-04-12 | Disposition: A | Discharge status: Home / Self Care | Payer: Managed Care, Other (non HMO) | Attending: Emergency Medicine | Admitting: Emergency Medicine

## 2017-04-12 ENCOUNTER — Encounter (HOSPITAL_COMMUNITY): Payer: Self-pay | Admitting: *Deleted

## 2017-04-12 DIAGNOSIS — Z79899 Other long term (current) drug therapy: Secondary | ICD-10-CM | POA: Insufficient documentation

## 2017-04-12 DIAGNOSIS — R21 Rash and other nonspecific skin eruption: Secondary | ICD-10-CM | POA: Diagnosis present

## 2017-04-12 DIAGNOSIS — F17210 Nicotine dependence, cigarettes, uncomplicated: Secondary | ICD-10-CM | POA: Diagnosis not present

## 2017-04-12 NOTE — ED Triage Notes (Signed)
C/O rash on the back of his neck and chest onset yest. Denies any new products

## 2017-04-12 NOTE — ED Provider Notes (Signed)
[facility]-EMERGENCY DEPT Provider Note   CSN: [id number] Arrival date & time: 04/12/17  0456     History   Chief Complaint Chief Complaint  Patient presents with  . Rash    HPI Dylan Douglas is a 26 y.o. male.  The history is provided by the patient and a significant other.  Rash   This is a chronic problem. The current episode started more than 1 week ago. The problem has been gradually worsening. The problem is associated with nothing. There has been no fever. Affected Location: back/chest. The pain is mild. The pain has been constant since onset. Pertinent negatives include no blisters, no itching and no pain.    Past Medical History:  Diagnosis Date  . IBS (irritable bowel syndrome)   . Palpitations     Patient Active Problem List   Diagnosis Date Noted  . IBS (irritable bowel syndrome) 05/27/2015  . Palpitations 07/31/2013    History reviewed. No pertinent surgical history.     Home Medications    Prior to Admission medications   Medication Sig Start Date End Date Taking? Authorizing Provider  dicyclomine (BENTYL) 20 MG tablet Take 1 tablet (20 mg total) by mouth 2 (two) times daily. 05/19/16   Gerhard Munch, MD  Loratadine 10 MG CAPS Take 10 mg by mouth daily as needed (allergies).     [provider]  metoCLOPramide (REGLAN) 10 MG tablet Take 1 tablet (10 mg total) by mouth 3 (three) times daily as needed for nausea (headache / nausea). 02/06/16   Eber Hong, MD  ondansetron (ZOFRAN ODT) 4 MG disintegrating tablet 4mg  ODT q6 hours prn nausea/vomit 02/05/16   [name], NP  predniSONE (DELTASONE) 20 MG tablet Take 2 tablets (40 mg total) by mouth daily with breakfast. For the next four days 05/19/16   Gerhard Munch, MD    Family History Family History  Problem Relation Age of Onset  . Breast cancer Mother   . Cancer Mother     Social History Social History  Substance Use Topics  . Smoking status: Current Some Day Smoker    Types: Cigars,  Cigarettes  . Smokeless tobacco: Never Used  . Alcohol use 0.0 oz/week     Comment: Occasionally     Allergies   Patient has no known allergies.   Review of Systems Review of Systems  Constitutional: Negative for fever.  Skin: Positive for rash. Negative for itching.     Physical Exam Updated Vital Signs BP 128/82 (BP Location: Left Arm)   Pulse 65   Temp 98.6 F (37 C) (Oral)   Resp 16   SpO2 97%   Physical Exam CONSTITUTIONAL: Well developed/well nourished HEAD: Normocephalic/atraumatic ENMT: Mucous membranes moist, no angioedema NECK: supple no meningeal signs ABDOMEN: soft NEURO: Pt is awake/alert/appropriate, moves all extremitiesx4.  EXTREMITIES: pulses normal/equal, full ROM SKIN: warm, color normal, macular rash to chest/back No urticaria noted.   PSYCH: no abnormalities of mood noted, alert and oriented to situation   ED Treatments / Results  Labs (all labs ordered are listed, but only abnormal results are displayed) Labs Reviewed - No data to display  EKG  EKG Interpretation None       Radiology No results found.  Procedures Procedures (including critical care time)  Medications Ordered in ED Medications - No data to display   Initial Impression / Assessment and Plan / ED Course  I have reviewed the triage vital signs and the nursing notes.   Referred to dermatology  as this has been progressing for "awhile" No signs of emergent cause at this time Advised sunscreen  Final Clinical Impressions(s) / ED Diagnoses   Final diagnoses:  Rash    New Prescriptions New Prescriptions   No medications on file     Zadie RhineWickline, Gisel Vipond, MD 04/12/17 [phone]

## 2017-04-24 ENCOUNTER — Emergency Department (HOSPITAL_COMMUNITY)
Admission: EM | Admit: 2017-04-24 | Discharge: 2017-04-24 | Disposition: A | Discharge status: Home / Self Care | Payer: Managed Care, Other (non HMO) | Attending: Emergency Medicine | Admitting: Emergency Medicine

## 2017-04-24 ENCOUNTER — Encounter (HOSPITAL_COMMUNITY): Payer: Self-pay | Admitting: Emergency Medicine

## 2017-04-24 DIAGNOSIS — Z79899 Other long term (current) drug therapy: Secondary | ICD-10-CM | POA: Insufficient documentation

## 2017-04-24 DIAGNOSIS — J029 Acute pharyngitis, unspecified: Secondary | ICD-10-CM | POA: Diagnosis not present

## 2017-04-24 DIAGNOSIS — F17290 Nicotine dependence, other tobacco product, uncomplicated: Secondary | ICD-10-CM | POA: Insufficient documentation

## 2017-04-24 LAB — RAPID STREP SCREEN (MED CTR MEBANE ONLY): STREPTOCOCCUS, GROUP A SCREEN (DIRECT): NEGATIVE

## 2017-04-24 NOTE — ED Notes (Signed)
Declined W/C at D/C and was escorted to lobby by RN. 

## 2017-04-24 NOTE — ED Provider Notes (Signed)
[facility]-EMERGENCY DEPT Provider Note   CSN: [id number] Arrival date & time: 04/24/17  1307     History   Chief Complaint Chief Complaint  Patient presents with  . Sore Throat  . Nasal Congestion    HPI Dylan Douglas is a 26 y.o. male.  HPI  26 y.o. Male with sore throat, subjective fever, myalgias, nasal congestion for 2 days.    Past Medical History:  Diagnosis Date  . IBS (irritable bowel syndrome)   . Palpitations     Patient Active Problem List   Diagnosis Date Noted  . IBS (irritable bowel syndrome) 05/27/2015  . Palpitations 07/31/2013    History reviewed. No pertinent surgical history.     Home Medications    Prior to Admission medications   Medication Sig Start Date End Date Taking? Authorizing Provider  dicyclomine (BENTYL) 20 MG tablet Take 1 tablet (20 mg total) by mouth 2 (two) times daily. 05/19/16   Gerhard MunchLockwood, Robert, MD  Loratadine 10 MG CAPS Take 10 mg by mouth daily as needed (allergies).     [provider]  metoCLOPramide (REGLAN) 10 MG tablet Take 1 tablet (10 mg total) by mouth 3 (three) times daily as needed for nausea (headache / nausea). 02/06/16   Eber HongMiller, Brian, MD  ondansetron (ZOFRAN ODT) 4 MG disintegrating tablet 4mg  ODT q6 hours prn nausea/vomit 02/05/16   Felicie MornSmith, David, NP  predniSONE (DELTASONE) 20 MG tablet Take 2 tablets (40 mg total) by mouth daily with breakfast. For the next four days 05/19/16   Gerhard MunchLockwood, Robert, MD    Family History Family History  Problem Relation Age of Onset  . Breast cancer Mother   . Cancer Mother     Social History Social History  Substance Use Topics  . Smoking status: Current Some Day Smoker    Types: Cigars, Cigarettes  . Smokeless tobacco: Never Used  . Alcohol use 0.0 oz/week     Comment: Occasionally     Allergies   Patient has no known allergies.   Review of Systems Review of Systems  All other systems reviewed and are negative.    Physical Exam Updated Vital Signs BP  120/89 (BP Location: Left Arm)   Pulse 100   Temp 99.3 F (37.4 C) (Oral)   Ht 1.803 m (5\' 11" )   Wt 97.1 kg (214 lb)   SpO2 96%   BMI 29.85 kg/m   Physical Exam  Constitutional: He is oriented to person, place, and time. He appears well-developed.  HENT:  Head: Normocephalic and atraumatic.  Right Ear: External ear normal.  Left Ear: External ear normal.  Nose: Nose normal.  Pharyngeal erytherema no exudate tonsillar swelling  Eyes: EOM are normal.  Neck: No tracheal deviation present.  Cardiovascular: Normal rate and regular rhythm.   Pulmonary/Chest: Effort normal and breath sounds normal.  Abdominal: Soft. Bowel sounds are normal.  Musculoskeletal: Normal range of motion.  Lymphadenopathy:    He has cervical adenopathy.  Neurological: He is alert and oriented to person, place, and time.  Skin: Skin is warm and dry.  Psychiatric: He has a normal mood and affect. His behavior is normal.  Nursing note and vitals reviewed.    ED Treatments / Results  Labs (all labs ordered are listed, but only abnormal results are displayed) Labs Reviewed  RAPID STREP SCREEN (NOT AT [facility])    EKG  EKG Interpretation None       Radiology No results found.  Procedures Procedures (including critical care time)  Medications Ordered in ED Medications - No data to display   Initial Impression / Assessment and Plan / ED Course  I have reviewed the triage vital signs and the nursing notes.  Pertinent labs & imaging results that were available during my care of the patient were reviewed by me and considered in my medical decision making (see chart for details).      Final Clinical Impressions(s) / ED Diagnoses   Final diagnoses:  Sore throat  Pharyngitis, unspecified etiology    New Prescriptions New Prescriptions   No medications on file     Margarita Grizzle, MD 04/24/17 2102

## 2017-04-24 NOTE — ED Triage Notes (Signed)
Pt. Stated, I've got inflammed tonsils, sore throat, congestion for 2 days,

## 2017-04-26 LAB — CULTURE, GROUP A STREP (THRC)

## 2018-01-27 ENCOUNTER — Ambulatory Visit: Payer: Managed Care, Other (non HMO) | Admitting: Physical Therapy

## 2018-02-10 ENCOUNTER — Ambulatory Visit: Payer: Managed Care, Other (non HMO) | Admitting: Physical Therapy

## 2018-04-30 ENCOUNTER — Encounter (HOSPITAL_COMMUNITY): Payer: Self-pay | Admitting: Emergency Medicine

## 2018-04-30 ENCOUNTER — Emergency Department (HOSPITAL_COMMUNITY): Payer: Managed Care, Other (non HMO)

## 2018-04-30 ENCOUNTER — Emergency Department (HOSPITAL_COMMUNITY)
Admission: EM | Admit: 2018-04-30 | Discharge: 2018-04-30 | Disposition: A | Discharge status: Home / Self Care | Payer: Managed Care, Other (non HMO) | Attending: Emergency Medicine | Admitting: Emergency Medicine

## 2018-04-30 ENCOUNTER — Other Ambulatory Visit: Payer: Self-pay

## 2018-04-30 DIAGNOSIS — F17210 Nicotine dependence, cigarettes, uncomplicated: Secondary | ICD-10-CM | POA: Diagnosis not present

## 2018-04-30 DIAGNOSIS — R1012 Left upper quadrant pain: Secondary | ICD-10-CM | POA: Insufficient documentation

## 2018-04-30 LAB — CBC
HEMATOCRIT: 44.8 % (ref 39.0–52.0)
HEMOGLOBIN: 15.9 g/dL (ref 13.0–17.0)
MCH: 31.2 pg (ref 26.0–34.0)
MCHC: 35.5 g/dL (ref 30.0–36.0)
MCV: 88 fL (ref 78.0–100.0)
Platelets: 257 10*3/uL (ref 150–400)
RBC: 5.09 MIL/uL (ref 4.22–5.81)
RDW: 13.6 % (ref 11.5–15.5)
WBC: 7.7 10*3/uL (ref 4.0–10.5)

## 2018-04-30 LAB — COMPREHENSIVE METABOLIC PANEL
ALBUMIN: 4.4 g/dL (ref 3.5–5.0)
ALT: 24 U/L (ref 17–63)
ANION GAP: 10 (ref 5–15)
AST: 22 U/L (ref 15–41)
Alkaline Phosphatase: 43 U/L (ref 38–126)
BILIRUBIN TOTAL: 0.6 mg/dL (ref 0.3–1.2)
BUN: 14 mg/dL (ref 6–20)
CO2: 27 mmol/L (ref 22–32)
Calcium: 9.3 mg/dL (ref 8.9–10.3)
Chloride: 106 mmol/L (ref 101–111)
Creatinine, Ser: 0.99 mg/dL (ref 0.61–1.24)
GFR calc non Af Amer: >60 mL/min (ref >60)
GLUCOSE: 108 mg/dL — AB (ref 65–99)
POTASSIUM: 3.8 mmol/L (ref 3.5–5.1)
SODIUM: 143 mmol/L (ref 135–145)
Total Protein: 7.4 g/dL (ref 6.5–8.1)

## 2018-04-30 LAB — URINALYSIS, ROUTINE W REFLEX MICROSCOPIC
BACTERIA UA: NONE SEEN
BILIRUBIN URINE: NEGATIVE
Glucose, UA: NEGATIVE mg/dL
HGB URINE DIPSTICK: NEGATIVE
Ketones, ur: NEGATIVE mg/dL
LEUKOCYTES UA: NEGATIVE
Nitrite: NEGATIVE
PROTEIN: NEGATIVE mg/dL
Specific Gravity, Urine: 1.026 (ref 1.005–1.030)
pH: 5 (ref 5.0–8.0)

## 2018-04-30 LAB — LIPASE, BLOOD: LIPASE: 31 U/L (ref 11–51)

## 2018-04-30 MED ORDER — MORPHINE SULFATE (PF) 4 MG/ML IV SOLN: 4.0000 mg | Freq: Once | INTRAVENOUS | Status: DC

## 2018-04-30 MED ORDER — ONDANSETRON HCL 4 MG/2ML IJ SOLN: 4.0000 mg | Freq: Once | INTRAMUSCULAR | Status: DC

## 2018-04-30 MED ORDER — OMEPRAZOLE 20 MG PO CPDR: 20.0000 mg | DELAYED_RELEASE_CAPSULE | Freq: Every day | ORAL | 0 refills | Status: DC

## 2018-04-30 MED ORDER — GI COCKTAIL ~~LOC~~
30.0000 mL | Freq: Once | ORAL | Status: AC
  Administered 2018-04-30: 30 mL via ORAL
  Filled 2018-04-30: qty 30

## 2018-04-30 MED ORDER — SODIUM CHLORIDE 0.9 % IV BOLUS
1000.0000 mL | Freq: Once | INTRAVENOUS | Status: AC
  Administered 2018-04-30: 1000 mL via INTRAVENOUS

## 2018-04-30 NOTE — ED Provider Notes (Signed)
[facility]-EMERGENCY DEPT Provider Note   CSN: [id number] Arrival date & time: 04/30/18  0854     History   Chief Complaint Chief Complaint  Patient presents with  . Abdominal Pain    HPI Dylan Douglas is a 27 y.o. male with a history of IBS who presents emergency department today for left quadrant abdominal pain.  Patient reports she has had intermittent left upper quadrant abdominal pain over the last several months.  He notes his most recent flare was over the last 1 week.  He reports that his pain typically occurs after eating a large meal and radiates to his left flank.  He describes it as a burning/gnawing sensation with associated nausea.  He denies any emesis.  He notes his pain typically last approximately 1 hour before relieving on its own.  Patient has never been seen or evaluated for this in the past.  He does note he uses NSAIDs on a frequent basis.  He has he drinks alcohol occasionally.  No marijuana use or other drug use.  Patient denies any right upper quadrant abdominal pain.  He denies any fever, chills, chest pain, shortness of breath, cough, recent surgery/travel, history of blood clots, lower extremity swelling, history of cancer, hemoptysis, diarrhea, urinary frequency, urinary urgency, dysuria, hematuria.  He does note that when he urinates his pain is worse.  He has taken over-the-counter medications for this without any relief.  He denies any prior abdominal surgeries.  Last bowel movement was today and normal.  No melena or hematochezia.  He still passing flatus.  HPI  Past Medical History:  Diagnosis Date  . IBS (irritable bowel syndrome)   . Palpitations     Patient Active Problem List   Diagnosis Date Noted  . IBS (irritable bowel syndrome) 05/27/2015  . Palpitations 07/31/2013    History reviewed. No pertinent surgical history.      Home Medications    Prior to Admission medications   Medication Sig Start Date End Date  Taking? Authorizing Provider  dicyclomine (BENTYL) 20 MG tablet Take 1 tablet (20 mg total) by mouth 2 (two) times daily. Patient not taking: Reported on 04/30/2018 05/19/16   Gerhard Munch, MD  metoCLOPramide (REGLAN) 10 MG tablet Take 1 tablet (10 mg total) by mouth 3 (three) times daily as needed for nausea (headache / nausea). Patient not taking: Reported on 04/30/2018 02/06/16   Eber Hong, MD  ondansetron [facility] ODT) 4 MG disintegrating tablet 4mg  ODT q6 hours prn nausea/vomit Patient not taking: Reported on 04/30/2018 02/05/16   [name], NP  predniSONE (DELTASONE) 20 MG tablet Take 2 tablets (40 mg total) by mouth daily with breakfast. For the next four days Patient not taking: Reported on 04/30/2018 05/19/16   Gerhard Munch, MD    Family History Family History  Problem Relation Age of Onset  . Breast cancer Mother   . Cancer Mother     Social History Social History   Tobacco Use  . Smoking status: Current Some Day Smoker    Types: Cigars, Cigarettes  . Smokeless tobacco: Never Used  Substance Use Topics  . Alcohol use: Yes    Alcohol/week: 0.0 oz    Comment: Occasionally  . Drug use: No     Allergies   Patient has no known allergies.   Review of Systems Review of Systems  All other systems reviewed and are negative.    Physical Exam Updated Vital Signs BP 128/81   Pulse 71  Temp 98.5 F (36.9 C) (Oral)   Resp 12   Ht 5\' 10"  (1.778 m)   Wt 99.8 kg (220 lb)   SpO2 98%   BMI 31.57 kg/m   Physical Exam  Constitutional: He appears well-developed and well-nourished.  HENT:  Head: Normocephalic and atraumatic.  Right Ear: External ear normal.  Left Ear: External ear normal.  Nose: Nose normal.  Mouth/Throat: Uvula is midline, oropharynx is clear and moist and mucous membranes are normal. No tonsillar exudate.  Eyes: Pupils are equal, round, and reactive to light. Right eye exhibits no discharge. Left eye exhibits no discharge. No scleral  icterus.  Neck: Trachea normal. Neck supple. No spinous process tenderness present. No neck rigidity. Normal range of motion present.  Cardiovascular: Normal rate, regular rhythm and intact distal pulses.  No murmur heard. Pulses:      Radial pulses are 2+ on the right side, and 2+ on the left side.       Dorsalis pedis pulses are 2+ on the right side, and 2+ on the left side.       Posterior tibial pulses are 2+ on the right side, and 2+ on the left side.  No lower extremity swelling or edema. Calves symmetric in size bilaterally.  Pulmonary/Chest: Effort normal and breath sounds normal. He exhibits no tenderness.  Abdominal: Soft. Bowel sounds are normal. There is no tenderness. There is no rigidity, no rebound, no guarding and no CVA tenderness.  Musculoskeletal: He exhibits no edema.  Lymphadenopathy:    He has no cervical adenopathy.  Neurological: He is alert.  Skin: Skin is warm and dry. No rash noted. He is not diaphoretic.  Psychiatric: He has a normal mood and affect.  Nursing note and vitals reviewed.    ED Treatments / Results  Labs (all labs ordered are listed, but only abnormal results are displayed) Labs Reviewed  COMPREHENSIVE METABOLIC PANEL - Abnormal; Notable for the following components:      Result Value   Glucose, Bld 108 (*)    All other components within normal limits  LIPASE, BLOOD  CBC  URINALYSIS, ROUTINE W REFLEX MICROSCOPIC    EKG None  Radiology Ct Renal Stone Study  Result Date: 04/30/2018 CLINICAL DATA:  Left upper quadrant pain radiating to back for several months. Nausea. Pain worse with urination. EXAM: CT ABDOMEN AND PELVIS WITHOUT CONTRAST TECHNIQUE: Multidetector CT imaging of the abdomen and pelvis was performed following the standard protocol without IV contrast. COMPARISON:  None. FINDINGS: Lower chest: Lung bases are normal. Hepatobiliary: Normal. Pancreas: Normal. Spleen: Normal Adrenals/Urinary Tract: Adrenal glands are normal.  Kidneys are normal in size without hydronephrosis or nephrolithiasis. Ureters and bladder are normal. Stomach/Bowel: Stomach and small bowel are normal. Appendix is normal. Colon is within normal. Vascular/Lymphatic: Normal. Reproductive: Normal. Other: No free fluid or focal inflammatory change. Musculoskeletal: Normal. IMPRESSION: No acute findings. Electronically Signed   By: Elberta Fortisaniel  Boyle M.D.   On: 04/30/2018 14:25    Procedures Procedures (including critical care time)  Medications Ordered in ED Medications  morphine 4 MG/ML injection 4 mg (4 mg Intravenous Refused 04/30/18 1252)  sodium chloride 0.9 % bolus 1,000 mL (1,000 mLs Intravenous New Bag/Given 04/30/18 1304)  ondansetron (ZOFRAN) injection 4 mg (4 mg Intravenous Refused 04/30/18 1253)  gi cocktail (Maalox,Lidocaine,Donnatal) (30 mLs Oral Given 04/30/18 1324)     Initial Impression / Assessment and Plan / ED Course  I have reviewed the triage vital signs and the nursing notes.  Pertinent  labs & imaging results that were available during my care of the patient were reviewed by me and considered in my medical decision making (see chart for details).     27 year old male with a history of IBS presenting for left upper abdominal pain over the last several months.  Pain occurs after eating.  He notes some urinary symptoms.  He denies any painful bowel movements make me concern for prostatitis.  Patient is without chest pain, cough or shortness of breath.  Do not suspect pneumonia.  His lungs are clear to auscultation bilaterally.  Patient is PERC negative.  Patient's vital signs are reassuring on presentation.  He is without any fever, tachycardia, tachypnea, hypoxia or hypotension.  Patient is without any abdominal tenderness palpation.  No CVA tenderness.  Labs done in triage are reassuring.  No leukocytosis.  No anemia.  No significant electrolyte derangements.  No DKA.  Kidney function within normal limits.  LFTs within normal limits.   Bilirubin within normal limits.  No anion gap acidosis.  Lipase within normal limits.  UA without evidence of UTI.  Discussed the patient I do not feel he needs a CT scan but he states that his "nurse friends" told him he needs well and he will find a way to get one.  An attempt to prevent return to the emergency department unnecessarily will get CT renal stone study.  This was unremarkable. Patient symptoms improved after Gi cocktail. Suspect patient's symptoms are related to gastritis. The evaluation does not show pathology that would require ongoing emergent intervention or inpatient treatment. I advised the patient to follow-up with PCP this week. I advised the patient to return to the emergency department with new or worsening symptoms or new concerns. Specific return precautions discussed. The patient verbalized understanding and agreement with plan. All questions answered. No further questions at this time. The patient is hemodynamically stable, mentating appropriately and appears safe for discharge.  Final Clinical Impressions(s) / ED Diagnoses   Final diagnoses:  Left upper quadrant pain    ED Discharge Orders        Ordered    omeprazole (PRILOSEC) 20 MG capsule  Daily     04/30/18 1449       Princella Pellegrini 04/30/18 1458    Arby Barrette, MD 05/03/18 [phone]

## 2018-04-30 NOTE — ED Triage Notes (Addendum)
Patient c/o LUQ pain radiating to back x months. Reports nausea but denies V/D. States pain worsens with urination.

## 2018-04-30 NOTE — Discharge Instructions (Signed)
Please read and follow all provided instructions You have been seen today for your complaint of: left upper abdominal pain Your lab work, urinalysis and CT scan were reassuring.  Please take medication as prescribed and follow attached handout. Please follow up with PCP vs gastroenterologist.  Vital signs: See below  Abdominal Pain  Your exam might not show the exact reason you have abdominal pain. Since there are many different causes of abdominal pain, another checkup and more tests may be needed. It is very important to follow up for lasting (persistent) or worsening symptoms. A possible cause of abdominal pain in any person who still has his or her appendix is acute appendicitis. Appendicitis is often hard to diagnose. Normal blood tests, urine tests, ultrasound, and CT scans do not completely rule out early appendicitis or other causes of abdominal pain. Sometimes, only the changes that happen over time will allow appendicitis and other causes of abdominal pain to be determined. Other potential problems that may require surgery may also take time to become more apparent. Because of this, it is important that you follow all of the instructions below.   HOME CARE INSTRUCTIONS  Do not take laxatives unless directed by your caregiver. Rest as much as possible.  Do not eat solid food until your pain is gone: A diet of water, weak decaffeinated tea, broth or bouillon, gelatin, oral rehydration solutions (ORS), frozen ice pops, or ice chips may be helpful.  When pain is gone: Start a light diet (dry toast, crackers, applesauce, or white rice). Increase the diet slowly as long as it does not bother you. Eat no dairy products (including cheese and eggs) and no spicy, fatty, fried, or high-fiber foods.  Use no alcohol, caffeine, or cigarettes.  Take your regular medicines unless your caregiver told you not to.  Take any prescribed medicine as directed.   SEEK IMMEDIATE MEDICAL CARE IF:  The pain does  not go away.  You have a fever >101 that does not go down with medication. You keep throwing up (vomiting) or cannot drink liquids.  You have shaking chills (rigors) The pain becomes localized (Pain in the right side could possibly be appendicitis. In an adult, pain in the left lower portion of the abdomen could be colitis or diverticulitis). You pass bloody or black tarry stools.  There is bright red blood in the stool.  There is blood in your vomit. Your bowel movements stop (become blocked) or you cannot pass gas.  The constipation stays for more than 4 days.  You have bloody, frequent, or painful urination.  You have yellow discoloration in the skin or whites of the eyes.  Your stomach becomes bloated or bigger.  You have dizziness or fainting.  You have chest or back pain. You have rectal pain.  You do not seem to be getting better.  You have any questions or concerns.   Your vital signs today were: BP 110/64    Pulse 69    Temp 98.5 F (36.9 C) (Oral)    Resp 15    Ht 5\' 10"  (1.778 m)    Wt 99.8 kg (220 lb)    SpO2 97%    BMI 31.57 kg/m  If your blood pressure (bp) was elevated above 135/85 this visit, please have this repeated by your doctor within one month.

## 2019-07-13 ENCOUNTER — Ambulatory Visit
Admission: EM | Admit: 2019-07-13 | Discharge: 2019-07-13 | Disposition: A | Discharge status: Home / Self Care | Payer: Managed Care, Other (non HMO) | Attending: Physician Assistant | Admitting: Physician Assistant

## 2019-07-13 ENCOUNTER — Encounter: Payer: Self-pay | Admitting: Emergency Medicine

## 2019-07-13 ENCOUNTER — Other Ambulatory Visit: Payer: Self-pay

## 2019-07-13 DIAGNOSIS — R2242 Localized swelling, mass and lump, left lower limb: Secondary | ICD-10-CM | POA: Diagnosis not present

## 2019-07-13 MED ORDER — MELOXICAM 7.5 MG PO TABS: 7.5000 mg | ORAL_TABLET | Freq: Every day | ORAL | 0 refills | Status: DC

## 2019-07-13 NOTE — ED Provider Notes (Signed)
[name]    CSN: [id number] Arrival date & time: 07/13/19  1841      History   Chief Complaint Chief Complaint  Patient presents with  . Mass    HPI Dylan Douglas is a 28 y.o. male.   28 year old male comes in for left foot mass.  He noticed the mass a few months ago, which has been stable without increasing in size until it started hurting a few days ago.  Denies red erythema, warmth.  Denies fever, chills.  States pain is more obvious after long days of work with swelling to the leg.  Has also noticed pain with pressure to the mass.  Has not tried anything for the symptoms.     Past Medical History:  Diagnosis Date  . IBS (irritable bowel syndrome)   . Palpitations     Patient Active Problem List   Diagnosis Date Noted  . IBS (irritable bowel syndrome) 05/27/2015  . Palpitations 07/31/2013    History reviewed. No pertinent surgical history.     Home Medications    Prior to Admission medications   Medication Sig Start Date End Date Taking? Authorizing Provider  meloxicam (MOBIC) 7.5 MG tablet Take 1 tablet (7.5 mg total) by mouth daily. 07/13/19   Cathie HoopsYu, Antonius Hartlage V, PA-C  dicyclomine (BENTYL) 20 MG tablet Take 1 tablet (20 mg total) by mouth 2 (two) times daily. Patient not taking: Reported on 04/30/2018 05/19/16 07/13/19  Gerhard MunchLockwood, Robert, MD  metoCLOPramide (REGLAN) 10 MG tablet Take 1 tablet (10 mg total) by mouth 3 (three) times daily as needed for nausea (headache / nausea). Patient not taking: Reported on 04/30/2018 02/06/16 07/13/19  Eber HongMiller, Brian, MD  omeprazole (PRILOSEC) 20 MG capsule Take 1 capsule (20 mg total) by mouth daily. 04/30/18 07/13/19  Maczis, Elmer SowMichael M, PA-C    Family History Family History  Problem Relation Age of Onset  . Breast cancer Mother   . Cancer Mother     Social History Social History   Tobacco Use  . Smoking status: Current Some Day Smoker    Types: Cigars, Cigarettes  . Smokeless tobacco: Never Used  Substance Use  Topics  . Alcohol use: Yes    Alcohol/week: 0.0 standard drinks    Comment: Occasionally  . Drug use: No     Allergies   Flagyl [metronidazole]   Review of Systems Review of Systems  Reason unable to perform ROS: See HPI as above.     Physical Exam Triage Vital Signs ED Triage Vitals [07/13/19 1849]  Enc Vitals Group     BP 132/84     Pulse Rate 94     Resp 16     Temp 98.3 F (36.8 C)     Temp Source Oral     SpO2 96 %     Weight      Height      Head Circumference      Peak Flow      Pain Score 8     Pain Loc      Pain Edu?      Excl. in GC?    No data found.  Updated Vital Signs BP 132/84 (BP Location: Left Arm)   Pulse 94   Temp 98.3 F (36.8 C) (Oral)   Resp 16   SpO2 96%   Physical Exam Constitutional:      General: He is not in acute distress.    Appearance: He is well-developed. He is not diaphoretic.  HENT:     Head: Normocephalic and atraumatic.  Eyes:     Conjunctiva/sclera: Conjunctivae normal.     Pupils: Pupils are equal, round, and reactive to light.  Pulmonary:     Effort: Pulmonary effort is normal. No respiratory distress.  Musculoskeletal:     Comments: 2 cm x 1 cm mobile hard mass to the left dorsal foot.  This is located to the mid second MTP.  Erythema, warmth.  No tenderness to palpation.  No fluctuance, induration.  Full range of motion of ankle, toes.  Strength normal and equal bilaterally.  Sensation intact ankle bilaterally.  Pedal pulse 2+.  Skin:    General: Skin is warm and dry.  Neurological:     Mental Status: He is alert and oriented to person, place, and time.      UC Treatments / Results  Labs (all labs ordered are listed, but only abnormal results are displayed) Labs Reviewed - No data to display  EKG   Radiology No results found.  Procedures Procedures (including critical care time)  Medications Ordered in UC Medications - No data to display  Initial Impression / Assessment and Plan / UC Course   I have reviewed the triage vital signs and the nursing notes.  Pertinent labs & imaging results that were available during my care of the patient were reviewed by me and considered in my medical decision making (see chart for details).    ? Ganglion cyst.  No signs of abscess, infected cyst.  Given no increase in size, can continue to monitor and treat for pain and inflammation with NSAIDs.  Patient to follow-up with orthopedics/podiatrist for further evaluation if noticing increasing in size to the mass.  Return precautions given.  Patient expresses understanding and agrees to plan.  Final Clinical Impressions(s) / UC Diagnoses   Final diagnoses:  Foot mass, left    ED Prescriptions    Medication Sig Dispense Auth. Provider   meloxicam (MOBIC) 7.5 MG tablet Take 1 tablet (7.5 mg total) by mouth daily. 15 tablet Tobin Chad, [place] 07/13/19 1919

## 2019-07-13 NOTE — ED Notes (Signed)
Patient able to ambulate independently  

## 2019-07-13 NOTE — ED Triage Notes (Addendum)
Pt presents to [facility] for assessment of mass to top of left foot for "several months".  Denies known injury.  Patient states pain increases with increases ambulation.

## 2019-07-13 NOTE — Discharge Instructions (Signed)
Start Mobic for inflammation. Do not take ibuprofen (motrin/advil)/ naproxen (aleve) while on mobic. Continue to monitor cyst. If increasing in size, please follow up with orthopedics/podiatrist for further evaluation needed.

## 2019-07-20 ENCOUNTER — Other Ambulatory Visit: Payer: Self-pay

## 2019-07-20 ENCOUNTER — Encounter: Payer: Self-pay | Admitting: Emergency Medicine

## 2019-07-20 ENCOUNTER — Ambulatory Visit
Admission: EM | Admit: 2019-07-20 | Discharge: 2019-07-20 | Disposition: A | Discharge status: Home / Self Care | Payer: Managed Care, Other (non HMO) | Attending: Emergency Medicine | Admitting: Emergency Medicine

## 2019-07-20 DIAGNOSIS — K529 Noninfective gastroenteritis and colitis, unspecified: Secondary | ICD-10-CM | POA: Diagnosis not present

## 2019-07-20 MED ORDER — MECLIZINE HCL 12.5 MG PO TABS: 12.5000 mg | ORAL_TABLET | Freq: Three times a day (TID) | ORAL | 0 refills | Status: DC | PRN

## 2019-07-20 NOTE — ED Triage Notes (Signed)
Pt presents to [facility] for assessment of left ear fullness for "a while".  Patient states hx of issues with his ear.  Pt c/o nausea with emesis 2-3 days ago, abdominal pain (LLQ), neck pain/achiness, chills, diarrhea, and dizziness.

## 2019-07-20 NOTE — ED Provider Notes (Signed)
[name]    CSN: [id number] Arrival date & time: 07/20/19  1038      History   Chief Complaint Chief Complaint  Patient presents with  . Nausea    HPI Dylan Douglas is a 28 y.o. male presenting for left ear fullness, nausea, abdominal pain, diarrhea, "feeling off"/dizzy.  Patient denies tinnitus, fever, cough, shortness of breath, blood in stool, known sick contacts.  Patient admits to emesis about 3 days ago; without bile or blood.  Patient has been able to keep down food and water for the last 3 days.  Patient denies fall, head trauma, LOC, hearing loss, upper or lower extremity weakness, facial droop, dysarthria.   Past Medical History:  Diagnosis Date  . IBS (irritable bowel syndrome)   . Palpitations     Patient Active Problem List   Diagnosis Date Noted  . IBS (irritable bowel syndrome) 05/27/2015  . Palpitations 07/31/2013    History reviewed. No pertinent surgical history.     Home Medications    Prior to Admission medications   Medication Sig Start Date End Date Taking? Authorizing Provider  meclizine (ANTIVERT) 12.5 MG tablet Take 1 tablet (12.5 mg total) by mouth 3 (three) times daily as needed for dizziness. 07/20/19   Hall-Potvin, [place], PA-C  dicyclomine (BENTYL) 20 MG tablet Take 1 tablet (20 mg total) by mouth 2 (two) times daily. Patient not taking: Reported on 04/30/2018 05/19/16 07/13/19  Carmin Muskrat, MD  metoCLOPramide (REGLAN) 10 MG tablet Take 1 tablet (10 mg total) by mouth 3 (three) times daily as needed for nausea (headache / nausea). Patient not taking: Reported on 04/30/2018 02/06/16 07/13/19  Noemi Chapel, MD  omeprazole (PRILOSEC) 20 MG capsule Take 1 capsule (20 mg total) by mouth daily. 04/30/18 07/13/19  Maczis, Barth Kirks, PA-C    Family History Family History  Problem Relation Age of Onset  . Breast cancer Mother   . Cancer Mother     Social History Social History   Tobacco Use  . Smoking status: Former Smoker     Types: Cigars, Cigarettes  . Smokeless tobacco: Never Used  Substance Use Topics  . Alcohol use: Yes    Alcohol/week: 0.0 standard drinks    Comment: Occasionally  . Drug use: No     Allergies   Flagyl [metronidazole]   Review of Systems Review of Systems  Constitutional: Positive for fatigue. Negative for activity change, appetite change and fever.  HENT: Negative for congestion, dental problem, ear pain, facial swelling, hearing loss, sinus pain, sore throat, trouble swallowing and voice change.   Eyes: Negative for photophobia, pain and visual disturbance.  Respiratory: Negative for cough and shortness of breath.   Cardiovascular: Negative for chest pain and palpitations.  Gastrointestinal: Positive for diarrhea and nausea. Negative for abdominal pain and vomiting.  Musculoskeletal: Negative for arthralgias, gait problem and myalgias.  Skin: Negative for rash and wound.  Neurological: Negative for tremors, syncope, facial asymmetry, speech difficulty, weakness, light-headedness, numbness and headaches.  All other systems reviewed and are negative.    Physical Exam Triage Vital Signs ED Triage Vitals  Enc Vitals Group     BP 07/20/19 1047 130/88     Pulse Rate 07/20/19 1047 68     Resp 07/20/19 1047 18     Temp 07/20/19 1047 98.2 F (36.8 C)     Temp Source 07/20/19 1047 Oral     SpO2 07/20/19 1047 97 %     Weight --  Height --      Head Circumference --      Peak Flow --      Pain Score 07/20/19 1048 5     Pain Loc --      Pain Edu? --      Excl. in GC? --    No data found.  Updated Vital Signs BP 130/88 (BP Location: Left Arm)   Pulse 68   Temp 98.2 F (36.8 C) (Oral)   Resp 18   SpO2 97%   Visual Acuity Right Eye Distance:   Left Eye Distance:   Bilateral Distance:    Right Eye Near:   Left Eye Near:    Bilateral Near:     Physical Exam Constitutional:      General: He is not in acute distress.    Appearance: He is normal weight. He  is not toxic-appearing.  HENT:     Head: Normocephalic and atraumatic.     Right Ear: Tympanic membrane, ear canal and external ear normal.     Left Ear: Tympanic membrane, ear canal and external ear normal.     Nose: No nasal deformity, congestion or rhinorrhea.     Comments: Turbinates edematous bilaterally with injected mucosa    Mouth/Throat:     Mouth: Mucous membranes are moist.     Tongue: Tongue does not deviate from midline.     Pharynx: Oropharynx is clear. Uvula midline. No pharyngeal swelling or oropharyngeal exudate.     Comments: No tonsillar hypertrophy or exudate Eyes:     General: No scleral icterus.    Extraocular Movements: Extraocular movements intact.     Conjunctiva/sclera: Conjunctivae normal.     Pupils: Pupils are equal, round, and reactive to light.  Neck:     Musculoskeletal: Normal range of motion and neck supple. No muscular tenderness.     Vascular: No JVD.     Trachea: No tracheal deviation.  Cardiovascular:     Rate and Rhythm: Normal rate and regular rhythm.     Pulses: Normal pulses.     Heart sounds: No murmur. No gallop.   Pulmonary:     Effort: Pulmonary effort is normal. No respiratory distress.     Breath sounds: No decreased breath sounds, wheezing, rhonchi or rales.  Chest:     Chest wall: No deformity, tenderness or crepitus.  Abdominal:     General: Abdomen is flat. Bowel sounds are normal. There is no distension.     Tenderness: There is no abdominal tenderness. There is no right CVA tenderness, left CVA tenderness, guarding or rebound.  Musculoskeletal: Normal range of motion.     Right lower leg: He exhibits no tenderness. No edema.     Left lower leg: He exhibits no tenderness. No edema.  Lymphadenopathy:     Cervical: No cervical adenopathy.  Skin:    General: Skin is warm.     Capillary Refill: Capillary refill takes less than 2 seconds.     Coloration: Skin is not cyanotic, jaundiced or pale.  Neurological:     General: No  focal deficit present.     Mental Status: He is alert and oriented to person, place, and time.     Cranial Nerves: No cranial nerve deficit.     Sensory: No sensory deficit.     Motor: No weakness.     Coordination: Coordination normal.     Gait: Gait normal.     Deep Tendon Reflexes: Reflexes normal.  Comments: Negative head jerk test  Psychiatric:        Mood and Affect: Mood normal.        Behavior: Behavior normal.      UC Treatments / Results  Labs (all labs ordered are listed, but only abnormal results are displayed) Labs Reviewed  NOVEL CORONAVIRUS, NAA    EKG   Radiology No results found.  Procedures Procedures (including critical care time)  Medications Ordered in UC Medications - No data to display  Initial Impression / Assessment and Plan / UC Course  I have reviewed the triage vital signs and the nursing notes.  Pertinent labs & imaging results that were available during my care of the patient were reviewed by me and considered in my medical decision making (see chart for details).     1.  Gastroenteritis Patient is afebrile, normocardiac, able to tolerate p.o. intake, nontoxic-appearing.  No neurocognitive deficit on exam, unable to reproduce vertiginous symptoms.  Could be due to initial dehydration which is resolved today.  Will trial Antivert for antiemetic, antivertiginous effects.  Emphasized importance of maintaining oral hydration.  COVID test pending given constellation of symptoms and patient working in public setting. Final Clinical Impressions(s) / UC Diagnoses   Final diagnoses:  Gastroenteritis     Discharge Instructions     Important to drink plenty of water, may supplement with Gatorade. Take Antivert as needed for nausea/dizziness. Go to ER if you develop severe headache, dizziness, vomiting, change in mental status.  Your COVID test is pending: Is important you quarantine at home until your results are back. You may take OTC  Tylenol for fever and myalgias.  It is important to drink plenty of water throughout the day to stay hydrated. If you test positive and later develop severe fever, cough, or shortness of breath, it is recommended that you go to the ER for further evaluation.    ED Prescriptions    Medication Sig Dispense Auth. Provider   meclizine (ANTIVERT) 12.5 MG tablet Take 1 tablet (12.5 mg total) by mouth 3 (three) times daily as needed for dizziness. 30 tablet Hall-Potvin, [place], PA-C     Controlled Substance Prescriptions [place] Controlled Substance Registry consulted? Not applicable   Hall-Potvin, [place], PA-C 07/20/19 1343

## 2019-07-20 NOTE — Discharge Instructions (Addendum)
Important to drink plenty of water, may supplement with Gatorade. Take Antivert as needed for nausea/dizziness. Go to ER if you develop severe headache, dizziness, vomiting, change in mental status.  Your COVID test is pending: Is important you quarantine at home until your results are back. You may take OTC Tylenol for fever and myalgias.  It is important to drink plenty of water throughout the day to stay hydrated. If you test positive and later develop severe fever, cough, or shortness of breath, it is recommended that you go to the ER for further evaluation.

## 2019-07-21 LAB — NOVEL CORONAVIRUS, NAA: SARS-CoV-2, NAA: NOT DETECTED

## 2019-07-23 ENCOUNTER — Encounter (HOSPITAL_COMMUNITY): Payer: Self-pay

## 2019-10-02 ENCOUNTER — Other Ambulatory Visit: Payer: Self-pay

## 2019-10-02 ENCOUNTER — Ambulatory Visit
Admission: EM | Admit: 2019-10-02 | Discharge: 2019-10-02 | Disposition: A | Discharge status: Home / Self Care | Payer: Managed Care, Other (non HMO) | Attending: Physician Assistant | Admitting: Physician Assistant

## 2019-10-02 DIAGNOSIS — J069 Acute upper respiratory infection, unspecified: Secondary | ICD-10-CM

## 2019-10-02 DIAGNOSIS — Z20822 Contact with and (suspected) exposure to covid-19: Secondary | ICD-10-CM

## 2019-10-02 DIAGNOSIS — Z20828 Contact with and (suspected) exposure to other viral communicable diseases: Secondary | ICD-10-CM | POA: Diagnosis not present

## 2019-10-02 MED ORDER — AZELASTINE HCL 0.1 % NA SOLN: 2.0000 | Freq: Two times a day (BID) | NASAL | 0 refills | Status: DC

## 2019-10-02 MED ORDER — AMOXICILLIN-POT CLAVULANATE 875-125 MG PO TABS: 1.0000 | ORAL_TABLET | Freq: Two times a day (BID) | ORAL | 0 refills | Status: DC

## 2019-10-02 MED ORDER — FLUTICASONE PROPIONATE 50 MCG/ACT NA SUSP: 2.0000 | Freq: Every day | NASAL | 0 refills | Status: DC

## 2019-10-02 NOTE — Discharge Instructions (Signed)
COVID testing ordered. I would like you to quarantine until testing results. If either you or your son test positive, everyone in the household will need to be quarantined despite testing results, we will await testing results to discuss further quarantine instructions. As discussed, right ear infection, start flonase and azelastine at this time. If further ear pain, can start augmentin to cover for infection. If experiencing shortness of breath, trouble breathing, go to the emergency department for further evaluation needed.

## 2019-10-02 NOTE — ED Triage Notes (Signed)
Pt states exposed to positive covid 09/25/2019 from [employer]. Pt c/o cough, congestion, sore throat, and bilateral ear pain x1wk.

## 2019-10-02 NOTE — ED Provider Notes (Signed)
[name]    CSN: [id number] Arrival date & time: 10/02/19  1314      History   Chief Complaint Chief Complaint  Patient presents with  . Cough    HPI Dylan Douglas is a 28 y.o. male.   28 year old male comes in for 1 week history of URI symptoms with positive COVID exposure at work on 09/25/2019. He has had cough, congestion, sore throat, bilateral ear pain, body aches. Denies fever, chills. Abdominal pain at baseline due to IBS, no changes. Denies nausea, vomiting, diarrhea. Denies shortness of breath, loss of taste/smell. Has not taken anything for the symptoms. Former smoker.      Past Medical History:  Diagnosis Date  . IBS (irritable bowel syndrome)   . Palpitations     Patient Active Problem List   Diagnosis Date Noted  . IBS (irritable bowel syndrome) 05/27/2015  . Palpitations 07/31/2013    History reviewed. No pertinent surgical history.     Home Medications    Prior to Admission medications   Medication Sig Start Date End Date Taking? Authorizing Provider  amoxicillin-clavulanate (AUGMENTIN) 875-125 MG tablet Take 1 tablet by mouth every 12 (twelve) hours. 10/02/19   Cathie Hoops,  V, PA-C  azelastine (ASTELIN) 0.1 % nasal spray Place 2 sprays into both nostrils 2 (two) times daily. 10/02/19   Cathie Hoops,  V, PA-C  fluticasone (FLONASE) 50 MCG/ACT nasal spray Place 2 sprays into both nostrils daily. 10/02/19   Belinda Fisher, PA-C  meclizine (ANTIVERT) 12.5 MG tablet Take 1 tablet (12.5 mg total) by mouth 3 (three) times daily as needed for dizziness. 07/20/19   Hall-Potvin, [place], PA-C  dicyclomine (BENTYL) 20 MG tablet Take 1 tablet (20 mg total) by mouth 2 (two) times daily. Patient not taking: Reported on 04/30/2018 05/19/16 07/13/19  Gerhard Munch, MD  metoCLOPramide (REGLAN) 10 MG tablet Take 1 tablet (10 mg total) by mouth 3 (three) times daily as needed for nausea (headache / nausea). Patient not taking: Reported on 04/30/2018 02/06/16 07/13/19   Eber Hong, MD  omeprazole (PRILOSEC) 20 MG capsule Take 1 capsule (20 mg total) by mouth daily. 04/30/18 07/13/19  Maczis, Elmer Sow, PA-C    Family History Family History  Problem Relation Age of Onset  . Breast cancer Mother   . Cancer Mother     Social History Social History   Tobacco Use  . Smoking status: Former Smoker    Types: Cigars, Cigarettes  . Smokeless tobacco: Never Used  Substance Use Topics  . Alcohol use: Yes    Alcohol/week: 0.0 standard drinks    Comment: Occasionally  . Drug use: No     Allergies   Flagyl [metronidazole]   Review of Systems Review of Systems  Reason unable to perform ROS: See HPI as above.     Physical Exam Triage Vital Signs ED Triage Vitals [10/02/19 1352]  Enc Vitals Group     BP 130/70     Pulse Rate 92     Resp 16     Temp 98.9 F (37.2 C)     Temp Source Oral     SpO2 96 %     Weight      Height      Head Circumference      Peak Flow      Pain Score 2     Pain Loc      Pain Edu?      Excl. in [place]?    No  data found.  Updated Vital Signs BP 130/70 (BP Location: Left Arm)   Pulse 92   Temp 98.9 F (37.2 C) (Oral)   Resp 16   SpO2 96%   Physical Exam Constitutional:      General: He is not in acute distress.    Appearance: Normal appearance. He is not ill-appearing, toxic-appearing or diaphoretic.  HENT:     Head: Normocephalic and atraumatic.     Right Ear: Ear canal and external ear normal. Tympanic membrane is erythematous. Tympanic membrane is not bulging.     Left Ear: Tympanic membrane, ear canal and external ear normal. Tympanic membrane is not erythematous or bulging.     Nose:     Right Sinus: No maxillary sinus tenderness or frontal sinus tenderness.     Left Sinus: No maxillary sinus tenderness or frontal sinus tenderness.     Mouth/Throat:     Mouth: Mucous membranes are moist.     Pharynx: Oropharynx is clear. Uvula midline.  Neck:     Musculoskeletal: Normal range of motion and neck  supple.  Cardiovascular:     Rate and Rhythm: Normal rate and regular rhythm.     Heart sounds: Normal heart sounds. No murmur. No friction rub. No gallop.   Pulmonary:     Effort: Pulmonary effort is normal. No accessory muscle usage, prolonged expiration, respiratory distress or retractions.     Comments: Lungs clear to auscultation without adventitious lung sounds. Neurological:     General: No focal deficit present.     Mental Status: He is alert and oriented to person, place, and time.      UC Treatments / Results  Labs (all labs ordered are listed, but only abnormal results are displayed) Labs Reviewed  NOVEL CORONAVIRUS, NAA    EKG   Radiology No results found.  Procedures Procedures (including critical care time)  Medications Ordered in UC Medications - No data to display  Initial Impression / Assessment and Plan / UC Course  I have reviewed the triage vital signs and the nursing notes.  Pertinent labs & imaging results that were available during my care of the patient were reviewed by me and considered in my medical decision making (see chart for details).  COVID testing ordered. Patient to quarantine until testing results return. Patient to remain in quarantine for now until results. Given exposure with symptoms, son also having symptoms (also tested today), quarantine instructions will change depending on results. No alarming signs on exam.  Patient speaking in full sentences without respiratory distress.  Symptomatic treatment discussed. If symptomatic treatment does not relieve ear pain, can start augmentin to cover for otitis media. Push fluids.  Return precautions given.  Patient expresses understanding and agrees to plan.  Final Clinical Impressions(s) / UC Diagnoses   Final diagnoses:  Viral URI  Exposure to COVID-19 virus    ED Prescriptions    Medication Sig Dispense Auth. Provider   fluticasone (FLONASE) 50 MCG/ACT nasal spray Place 2 sprays into  both nostrils daily. 1 g ,  V, PA-C   azelastine (ASTELIN) 0.1 % nasal spray Place 2 sprays into both nostrils 2 (two) times daily. 30 mL ,  V, PA-C   amoxicillin-clavulanate (AUGMENTIN) 875-125 MG tablet Take 1 tablet by mouth every 12 (twelve) hours. 14 tablet Ok Edwards, PA-C     PDMP not reviewed this encounter.   Ok Edwards, PA-C 10/02/19 1514

## 2019-10-04 LAB — NOVEL CORONAVIRUS, NAA: SARS-CoV-2, NAA: NOT DETECTED

## 2019-10-07 ENCOUNTER — Telehealth: Payer: Managed Care, Other (non HMO) | Admitting: Family

## 2019-10-07 DIAGNOSIS — J069 Acute upper respiratory infection, unspecified: Secondary | ICD-10-CM

## 2019-10-07 MED ORDER — BENZONATATE 100 MG PO CAPS: 100.0000 mg | ORAL_CAPSULE | Freq: Three times a day (TID) | ORAL | 0 refills | Status: DC | PRN

## 2019-10-07 MED ORDER — FLUTICASONE PROPIONATE 50 MCG/ACT NA SUSP: 2.0000 | Freq: Every day | NASAL | 6 refills | Status: DC

## 2019-10-07 NOTE — Progress Notes (Signed)
We are sorry you are not feeling well.  Here is how we plan to help!  Based on what you have shared with me, it looks like you may have a viral upper respiratory infection.  Upper respiratory infections are caused by a large number of viruses; however, rhinovirus is the most common cause.   Symptoms vary from person to person, with common symptoms including sore throat, cough, fatigue or lack of energy and feeling of general discomfort.  A low-grade fever of up to 100.4 may present, but is often uncommon.  Symptoms vary however, and are closely related to a person's age or underlying illnesses.  The most common symptoms associated with an upper respiratory infection are nasal discharge or congestion, cough, sneezing, headache and pressure in the ears and face.  These symptoms usually persist for about 3 to 10 days, but can last up to 2 weeks.  It is important to know that upper respiratory infections do not cause serious illness or complications in most cases.    Upper respiratory infections can be transmitted from person to person, with the most common method of transmission being a person's hands.  The virus is able to live on the skin and can infect other persons for up to 2 hours after direct contact.  Also, these can be transmitted when someone coughs or sneezes; thus, it is important to cover the mouth to reduce this risk.  To keep the spread of the illness at bay, good hand hygiene is very important.  This is an infection that is most likely caused by a virus. There are no specific treatments other than to help you with the symptoms until the infection runs its course.  We are sorry you are not feeling well.  Here is how we plan to help!   For nasal congestion, you may use an oral decongestants such as Mucinex D or if you have glaucoma or high blood pressure use plain Mucinex.  Saline nasal spray or nasal drops can help and can safely be used as often as needed for congestion.  For your congestion,  I have prescribed Fluticasone nasal spray one spray in each nostril twice a day  If you do not have a history of heart disease, hypertension, diabetes or thyroid disease, prostate/bladder issues or glaucoma, you may also use Sudafed to treat nasal congestion.  It is highly recommended that you consult with a pharmacist or your primary care physician to ensure this medication is safe for you to take.      If you have a cough, you may use cough suppressants such as Delsym and Robitussin.  If you have glaucoma or high blood pressure, you can also use Coricidin HBP.   For cough I have prescribed for you A prescription cough medication called Tessalon Perles 100 mg. You may take 1-2 capsules every 8 hours as needed for cough  If you have a sore or scratchy throat, use a saltwater gargle-  to  teaspoon of salt dissolved in a 4-ounce to 8-ounce glass of warm water.  Gargle the solution for approximately 15-30 seconds and then spit.  It is important not to swallow the solution.  You can also use throat lozenges/cough drops and Chloraseptic spray to help with throat pain or discomfort.  Warm or cold liquids can also be helpful in relieving throat pain.  For headache, pain or general discomfort, you can use Ibuprofen or Tylenol as directed.   Some authorities believe that zinc sprays or the use  of Echinacea may shorten the course of your symptoms.  You may also need to get tested for COVID. You can go to one of the testing sites listed below, while they are opened (see hours). You do not need an order and will stay in your car during the test. You do need to self isolate until your results return and if positive 14 days from when your symptoms started and until you are 3 days fever free.   Testing Locations (Monday - Friday, 10 a.m. - 3 p.m.) . [place]: [facility], [address] [place], [place]: [place], [place],  [place], [place] (entrance [address])  . [facility]: (Closed each Monday): Testing site relocated to the short stay covered drive at [facility]. (Use the [employer] entrance to [facility] next to [place] . Only take medications as instructed by your medical team. . Be sure to drink plenty of fluids. Water is fine as well as fruit juices, sodas and electrolyte beverages. You may want to stay away from caffeine or alcohol. If you are nauseated, try taking small sips of liquids. How do you know if you are getting enough fluid? Your urine should be a pale yellow or almost colorless. . Get rest. . Taking a steamy shower or using a humidifier may help nasal congestion and ease sore throat pain. You can place a towel over your head and breathe in the steam from hot water coming from a faucet. . Using a saline nasal spray works much the same way. . Cough drops, hard candies and sore throat lozenges may ease your cough. . Avoid close contacts especially the very young and the elderly . Cover your mouth if you cough or sneeze . Always remember to wash your hands.   GET HELP RIGHT AWAY IF: . You develop worsening fever. . If your symptoms do not improve within 10 days . You develop yellow or green discharge from your nose over 3 days. . You have coughing fits . You develop a severe head ache or visual changes. . You develop shortness of breath, difficulty breathing or start having chest pain . Your symptoms persist after you have completed your treatment plan  MAKE SURE YOU   Understand these instructions.  Will watch your condition.  Will get help right away if you are not doing well or get worse.  Your e-visit answers were reviewed by a board certified advanced clinical practitioner to complete your personal care plan. Depending upon the condition, your plan could have included both over the counter or prescription  medications. Please review your pharmacy choice. If there is a problem, you may call our nursing hot line at and have the prescription routed to another pharmacy. Your safety is important to [place]. If you have drug allergies check your prescription carefully.   You can use MyChart to ask questions about today's visit, request a non-urgent call back, or ask for a work or school excuse for 24 hours related to this e-Visit. If it has been greater than 24 hours you will need to follow up with your provider, or enter a new e-Visit to address those concerns. You will get an e-mail in the next two days asking about your experience.  I hope that your e-visit has been valuable and will speed your recovery. Thank you for using e-visits.    Approximately  5 minutes was spent documenting and reviewing patient's chart.    

## 2019-10-09 ENCOUNTER — Telehealth: Payer: Managed Care, Other (non HMO) | Admitting: Physician Assistant

## 2019-10-09 DIAGNOSIS — R05 Cough: Secondary | ICD-10-CM

## 2019-10-09 DIAGNOSIS — R059 Cough, unspecified: Secondary | ICD-10-CM

## 2019-10-09 DIAGNOSIS — J029 Acute pharyngitis, unspecified: Secondary | ICD-10-CM | POA: Diagnosis not present

## 2019-10-09 NOTE — Progress Notes (Signed)
We are sorry that you are not feeling well.  Here is how we plan to help!  Your symptoms indicate a likely viral infection (Pharyngitis).   Pharyngitis is inflammation in the back of the throat which can cause a sore throat, scratchiness and sometimes difficulty swallowing.   Pharyngitis is typically caused by a respiratory virus and will just run its course.  Please keep in mind that your symptoms could last up to 10 days.    For throat pain, we recommend over the counter oral pain relief medications such as acetaminophen or aspirin, or anti-inflammatory medications such as ibuprofen or naproxen sodium.  Topical treatments such as oral throat lozenges or sprays may be used as needed.  Avoid close contact with loved ones, especially the very young and elderly.    Stay well hydrated. Drink enough water and fluids to keep your urine clear or pale yellow. Get lost of rest. Wash your hands often.   Advil or ibuprofen for pain. Do not take Aspirin.   Cepacol throat lozenges. Gargle with 8 oz of salt water ( tsp of salt per 1 qt of water) as often as every 1-2 hours to soothe your throat. For sore throat try using a honey-based tea. Use 3 teaspoons of honey with juice squeezed from half lemon. Place shaved pieces of ginger into 1/2-1 cup of water and warm over stove top. Then mix the ingredients and repeat every 4 hours as needed.  -Foods that can help speed recovery: honey, garlic, chicken soup, elderberries, green tea.  -Supplements that can help speed recovery: vitamin C, zinc, elderberry extract   Remember to wash your hands thoroughly throughout the day as this is the number one way to prevent the spread of infection and wipe down door knobs and counters with disinfectant.  You may need formal testing in clinic or office to confirm if your symptoms continue or worsen.   Home Care:  Only take medications as instructed by your medical team.  Do not drink alcohol while taking these  medications.  A steam or ultrasonic humidifier can help congestion.  You can place a towel over your head and breathe in the steam from hot water coming from a faucet.  Avoid close contacts especially the very young and the elderly.  Cover your mouth when you cough or sneeze.  Always remember to wash your hands.  Get Help Right Away If:  You develop worsening fever or throat pain.  You develop a severe head ache or visual changes.  Your symptoms persist after you have completed your treatment plan.  Make sure you  Understand these instructions.  Will watch your condition.  Will get help right away if you are not doing well or get worse.  Your e-visit answers were reviewed by a board certified advanced clinical practitioner to complete your personal care plan.  Depending on the condition, your plan could have included both over the counter or prescription medications.  If there is a problem please reply  once you have received a response from your provider.  Your safety is important to [place].  If you have drug allergies check your prescription carefully.    You can use MyChart to ask questions about todays visit, request a non-urgent call back, or ask for a work or school excuse for 24 hours related to this e-Visit. If it has been greater than 24 hours you will need to follow up with your provider, or enter a new e-Visit to address those concerns.  You will get an e-mail in the next two days asking about your experience.  I hope that your e-visit has been valuable and will speed your recovery. Thank you for using e-visits.   Greater than 5 minutes, yet less than 10 minutes of time have been spent researching, coordinating and implementing care for this patient today.

## 2019-12-10 ENCOUNTER — Other Ambulatory Visit: Payer: Self-pay

## 2019-12-10 ENCOUNTER — Ambulatory Visit
Admission: EM | Admit: 2019-12-10 | Discharge: 2019-12-10 | Disposition: A | Discharge status: Home / Self Care | Payer: Managed Care, Other (non HMO) | Attending: Physician Assistant | Admitting: Physician Assistant

## 2019-12-10 DIAGNOSIS — Z20822 Contact with and (suspected) exposure to covid-19: Secondary | ICD-10-CM | POA: Diagnosis not present

## 2019-12-10 DIAGNOSIS — R5383 Other fatigue: Secondary | ICD-10-CM | POA: Diagnosis not present

## 2019-12-10 NOTE — ED Provider Notes (Signed)
[name]    CSN: [id number] Arrival date & time: 12/10/19  1616      History   Chief Complaint Chief Complaint  Patient presents with  . Fatigue    HPI Dylan Douglas is a 29 y.o. male.   29 year old male comes in for 5 day history of URI symptoms. Has had body aches, rhinorrhea, sore throat, fatigue. Denies fever, chills. Denies abdominal pain, nausea, vomiting, diarrhea. Denies loss of taste/smell. Both parents positive for COVID. 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   States for the past 1-2 months, will feel short of breath with back pain when sitting up or standing. However, able to ambulate without shortness of breath.      Past Medical History:  Diagnosis Date  . IBS (irritable bowel syndrome)   . Palpitations     Patient Active Problem List   Diagnosis Date Noted  . IBS (irritable bowel syndrome) 05/27/2015  . Palpitations 07/31/2013    History reviewed. No pertinent surgical history.     Home Medications    Prior to Admission medications   Medication Sig Start Date End Date Taking? Authorizing Provider  meclizine (ANTIVERT) 12.5 MG tablet Take 1 tablet (12.5 mg total) by mouth 3 (three) times daily as needed for dizziness. 07/20/19   Hall-Potvin, [place], PA-C  dicyclomine (BENTYL) 20 MG tablet Take 1 tablet (20 mg total) by mouth 2 (two) times daily. Patient not taking: Reported on 04/30/2018 05/19/16 07/13/19  Carmin Muskrat, MD  metoCLOPramide (REGLAN) 10 MG tablet Take 1 tablet (10 mg total) by mouth 3 (three) times daily as needed for nausea (headache / nausea). Patient not taking: Reported on 04/30/2018 02/06/16 07/13/19  Noemi Chapel, MD  omeprazole (PRILOSEC) 20 MG capsule Take 1 capsule (20 mg total) by mouth daily. 04/30/18 07/13/19  Maczis, Barth Kirks, PA-C    Family History Family History  Problem Relation Age of Onset  . Breast cancer Mother   . Cancer Mother     Social History Social History   Tobacco Use  .  Smoking status: Former Smoker    Types: Cigars, Cigarettes  . Smokeless tobacco: Never Used  Substance Use Topics  . Alcohol use: Yes    Alcohol/week: 0.0 standard drinks    Comment: Occasionally  . Drug use: No     Allergies   Flagyl [metronidazole]   Review of Systems Review of Systems  Reason unable to perform ROS: See HPI as above.     Physical Exam Triage Vital Signs ED Triage Vitals  Enc Vitals Group     BP 12/10/19 1633 (!) 148/89     Pulse Rate 12/10/19 1633 77     Resp 12/10/19 1633 16     Temp 12/10/19 1633 98.5 F (36.9 C)     Temp Source 12/10/19 1633 Oral     SpO2 12/10/19 1633 96 %     Weight --      Height --      Head Circumference --      Peak Flow --      Pain Score 12/10/19 1634 6     Pain Loc --      Pain Edu? --      Excl. in [place]? --    No data found.  Updated Vital Signs BP (!) 148/89 (BP Location: Left Arm)   Pulse 77   Temp 98.5 F (36.9 C) (Oral)   Resp 16   SpO2 96%   Physical  Exam Constitutional:      General: He is not in acute distress.    Appearance: Normal appearance. He is not ill-appearing, toxic-appearing or diaphoretic.  HENT:     Head: Normocephalic and atraumatic.     Mouth/Throat:     Mouth: Mucous membranes are moist.     Pharynx: Oropharynx is clear. Uvula midline.  Cardiovascular:     Rate and Rhythm: Normal rate and regular rhythm.     Heart sounds: Normal heart sounds. No murmur. No friction rub. No gallop.   Pulmonary:     Effort: Pulmonary effort is normal. No accessory muscle usage, prolonged expiration, respiratory distress or retractions.     Comments: Lungs clear to auscultation without adventitious lung sounds. Musculoskeletal:     Cervical back: Normal range of motion and neck supple.  Neurological:     General: No focal deficit present.     Mental Status: He is alert and oriented to person, place, and time.      UC Treatments / Results  Labs (all labs ordered are listed, but only abnormal  results are displayed) Labs Reviewed  NOVEL CORONAVIRUS, NAA    EKG   Radiology No results found.  Procedures Procedures (including critical care time)  Medications Ordered in UC Medications - No data to display  Initial Impression / Assessment and Plan / UC Course  I have reviewed the triage vital signs and the nursing notes.  Pertinent labs & imaging results that were available during my care of the patient were reviewed by me and considered in my medical decision making (see chart for details).    COVID PCR test ordered. However, given patient's exposure/symptoms, suspect COVID and will have patient follow quarantine instructions for COVID. No alarming signs on exam.  Patient speaking in full sentences without respiratory distress.  Symptomatic treatment discussed.  Push fluids.  Return precautions given.  Patient expresses understanding and agrees to plan.  Final Clinical Impressions(s) / UC Diagnoses   Final diagnoses:  Suspected COVID-19 virus infection    ED Prescriptions    None     PDMP not reviewed this encounter.   Belinda Fisher, PA-C 12/10/19 1711

## 2019-12-10 NOTE — ED Triage Notes (Signed)
Pt states exposed to positive covid last Thursday from his mom and dad, mom is now in ICU. Pt c/o fatigue, body aches, runny nose and sore throat since last Thursday.

## 2019-12-10 NOTE — Discharge Instructions (Addendum)
COVID PCR testing ordered. As discussed, given your exposure and symptoms, I would like you to quarantine as if you are positive for COVID regardless of symptoms.  You can discontinue quarantine after 10 days of symptom onset AND 24 hours of no fever with improvement of symptoms. You can take over the counter flonase/nasacort to help with nasal congestion/drainage. Tylenol/motrin for pain and fever. Keep hydrated, urine should be clear to pale yellow in color. If experiencing shortness of breath, trouble breathing, go to the emergency department for further evaluation needed.   

## 2019-12-11 LAB — NOVEL CORONAVIRUS, NAA: SARS-CoV-2, NAA: NOT DETECTED

## 2020-03-14 ENCOUNTER — Emergency Department (HOSPITAL_COMMUNITY)
Admission: EM | Admit: 2020-03-14 | Discharge: 2020-03-14 | Disposition: A | Discharge status: Home / Self Care | Payer: Managed Care, Other (non HMO) | Attending: Emergency Medicine | Admitting: Emergency Medicine

## 2020-03-14 ENCOUNTER — Other Ambulatory Visit: Payer: Self-pay

## 2020-03-14 ENCOUNTER — Encounter (HOSPITAL_COMMUNITY): Payer: Self-pay | Admitting: Emergency Medicine

## 2020-03-14 DIAGNOSIS — Z87891 Personal history of nicotine dependence: Secondary | ICD-10-CM | POA: Diagnosis not present

## 2020-03-14 DIAGNOSIS — R1013 Epigastric pain: Secondary | ICD-10-CM | POA: Diagnosis not present

## 2020-03-14 DIAGNOSIS — R110 Nausea: Secondary | ICD-10-CM | POA: Insufficient documentation

## 2020-03-14 LAB — COMPREHENSIVE METABOLIC PANEL
ALT: 17 U/L (ref 0–44)
AST: 18 U/L (ref 15–41)
Albumin: 4.4 g/dL (ref 3.5–5.0)
Alkaline Phosphatase: 49 U/L (ref 38–126)
Anion gap: 10 (ref 5–15)
BUN: 10 mg/dL (ref 6–20)
CO2: 27 mmol/L (ref 22–32)
Calcium: 9.4 mg/dL (ref 8.9–10.3)
Chloride: 102 mmol/L (ref 98–111)
Creatinine, Ser: 1.08 mg/dL (ref 0.61–1.24)
GFR calc Af Amer: >60 mL/min (ref >60)
GFR calc non Af Amer: >60 mL/min (ref >60)
Glucose, Bld: 109 mg/dL — ABNORMAL HIGH (ref 70–99)
Potassium: 3.8 mmol/L (ref 3.5–5.1)
Sodium: 139 mmol/L (ref 135–145)
Total Bilirubin: 0.7 mg/dL (ref 0.3–1.2)
Total Protein: 7.3 g/dL (ref 6.5–8.1)

## 2020-03-14 LAB — CBC
HCT: 47.8 % (ref 39.0–52.0)
Hemoglobin: 17 g/dL (ref 13.0–17.0)
MCH: 31 pg (ref 26.0–34.0)
MCHC: 35.6 g/dL (ref 30.0–36.0)
MCV: 87.1 fL (ref 80.0–100.0)
Platelets: 261 10*3/uL (ref 150–400)
RBC: 5.49 MIL/uL (ref 4.22–5.81)
RDW: 13.1 % (ref 11.5–15.5)
WBC: 6.8 10*3/uL (ref 4.0–10.5)
nRBC: 0 % (ref 0.0–0.2)

## 2020-03-14 LAB — LIPASE, BLOOD: Lipase: 30 U/L (ref 11–51)

## 2020-03-14 MED ORDER — SUCRALFATE 1 GM/10ML PO SUSP: 1.0000 g | Freq: Three times a day (TID) | ORAL | 0 refills | Status: DC

## 2020-03-14 MED ORDER — LIDOCAINE VISCOUS HCL 2 % MT SOLN
15.0000 mL | Freq: Once | OROMUCOSAL | Status: AC
  Administered 2020-03-14: 13:00:00 15 mL via ORAL
  Filled 2020-03-14: qty 15

## 2020-03-14 MED ORDER — OMEPRAZOLE 20 MG PO CPDR: 20.0000 mg | DELAYED_RELEASE_CAPSULE | Freq: Every day | ORAL | 0 refills | Status: DC

## 2020-03-14 MED ORDER — ONDANSETRON 4 MG PO TBDP
4.0000 mg | ORAL_TABLET | Freq: Once | ORAL | Status: AC
  Administered 2020-03-14: 4 mg via ORAL
  Filled 2020-03-14: qty 1

## 2020-03-14 MED ORDER — ONDANSETRON 4 MG PO TBDP: 4.0000 mg | ORAL_TABLET | Freq: Three times a day (TID) | ORAL | 0 refills | Status: DC | PRN

## 2020-03-14 MED ORDER — ALUM & MAG HYDROXIDE-SIMETH 200-200-20 MG/5ML PO SUSP
30.0000 mL | Freq: Once | ORAL | Status: AC
  Administered 2020-03-14: 30 mL via ORAL
  Filled 2020-03-14: qty 30

## 2020-03-14 MED ORDER — SODIUM CHLORIDE 0.9% FLUSH: 3.0000 mL | Freq: Once | INTRAVENOUS | Status: DC

## 2020-03-14 NOTE — ED Provider Notes (Signed)
[facility] EMERGENCY DEPARTMENT Provider Note   CSN: [id number] Arrival date & time: 03/14/20  1218     History Chief Complaint  Patient presents with  . Abdominal Pain    Dylan Douglas is a 29 y.o. male with a history of IBS who presents to the ED with complaints of intermittent abdominal pain for past several months. Pain when occurring is located in the epigastrium, non-radiating, feels sharp, lasts a couple hours at a time, occurs a few times per week. No specific triggers or alleviating/aggravating factors the patient can identify.  No intervention prior to arrival.  States he sometimes has associated nausea.  Denies fever, emesis, diarrhea, melena, hematochezia, dysuria, chest pain, or dyspnea.  No recent lifestyle or diet changes.  HPI     Past Medical History:  Diagnosis Date  . IBS (irritable bowel syndrome)   . Palpitations     Patient Active Problem List   Diagnosis Date Noted  . IBS (irritable bowel syndrome) 05/27/2015  . Palpitations 07/31/2013    History reviewed. No pertinent surgical history.     Family History  Problem Relation Age of Onset  . Breast cancer Mother   . Cancer Mother     Social History   Tobacco Use  . Smoking status: Former Smoker    Types: Cigars, Cigarettes  . Smokeless tobacco: Never Used  Substance Use Topics  . Alcohol use: Yes    Alcohol/week: 0.0 standard drinks    Comment: Occasionally  . Drug use: No    Home Medications Prior to Admission medications   Medication Sig Start Date End Date Taking? Authorizing Provider  meclizine (ANTIVERT) 12.5 MG tablet Take 1 tablet (12.5 mg total) by mouth 3 (three) times daily as needed for dizziness. 07/20/19   Hall-Potvin, [place], PA-C  dicyclomine (BENTYL) 20 MG tablet Take 1 tablet (20 mg total) by mouth 2 (two) times daily. Patient not taking: Reported on 04/30/2018 05/19/16 07/13/19  Carmin Muskrat, MD  metoCLOPramide (REGLAN) 10 MG tablet Take 1 tablet  (10 mg total) by mouth 3 (three) times daily as needed for nausea (headache / nausea). Patient not taking: Reported on 04/30/2018 02/06/16 07/13/19  Noemi Chapel, MD  omeprazole (PRILOSEC) 20 MG capsule Take 1 capsule (20 mg total) by mouth daily. 04/30/18 07/13/19  Maczis, Barth Kirks, PA-C    Allergies    Flagyl [metronidazole]  Review of Systems   Review of Systems  Constitutional: Negative for fever.  Respiratory: Negative for shortness of breath.   Cardiovascular: Negative for chest pain.  Gastrointestinal: Positive for abdominal pain and nausea. Negative for anal bleeding, blood in stool, constipation, diarrhea and vomiting.  Genitourinary: Negative for dysuria, scrotal swelling and testicular pain.  Neurological: Negative for syncope.  All other systems reviewed and are negative.   Physical Exam Updated Vital Signs BP 134/82 (BP Location: Right Arm)   Pulse 83   Temp 98.5 F (36.9 C) (Oral)   Resp 15   Ht 5\' 9"  (1.753 m)   Wt 99.8 kg   SpO2 97%   BMI 32.49 kg/m   Physical Exam Vitals and nursing note reviewed.  Constitutional:      General: He is not in acute distress.    Appearance: He is well-developed. He is not toxic-appearing.  HENT:     Head: Normocephalic and atraumatic.  Eyes:     General:        Right eye: No discharge.        Left eye:  No discharge.     Conjunctiva/sclera: Conjunctivae normal.  Cardiovascular:     Rate and Rhythm: Normal rate and regular rhythm.  Pulmonary:     Effort: Pulmonary effort is normal. No respiratory distress.     Breath sounds: Normal breath sounds. No wheezing, rhonchi or rales.  Abdominal:     General: There is no distension.     Palpations: Abdomen is soft.     Tenderness: There is abdominal tenderness in the epigastric area and left upper quadrant. There is no guarding or rebound. Negative signs include Murphy's sign and McBurney's sign.  Musculoskeletal:     Cervical back: Neck supple.  Skin:    General: Skin is warm  and dry.     Findings: No rash.  Neurological:     Mental Status: He is alert.     Comments: Clear speech.   Psychiatric:        Behavior: Behavior normal.     ED Results / Procedures / Treatments   Labs (all labs ordered are listed, but only abnormal results are displayed) Labs Reviewed  CBC  LIPASE, BLOOD  COMPREHENSIVE METABOLIC PANEL    EKG None  Radiology No results found.  Procedures Procedures (including critical care time)  Medications Ordered in ED Medications  sodium chloride flush (NS) 0.9 % injection 3 mL (has no administration in time range)  ondansetron (ZOFRAN-ODT) disintegrating tablet 4 mg (4 mg Oral Given 03/14/20 1302)  alum & mag hydroxide-simeth (MAALOX/MYLANTA) 200-200-20 MG/5ML suspension 30 mL (30 mLs Oral Given 03/14/20 1303)    And  lidocaine (XYLOCAINE) 2 % viscous mouth solution 15 mL (15 mLs Oral Given 03/14/20 1303)    ED Course  I have reviewed the triage vital signs and the nursing notes.  Pertinent labs & imaging results that were available during my care of the patient were reviewed by me and considered in my medical decision making (see chart for details).    MDM Rules/Calculators/A&P                     Patient presents to the ED with complaints of intermittent abdominal pain for past several months. Nontoxic, vitals WNL. Exam with mild epigastric & LUQ abdominal tenderness, no peritoneal signs.   Ddx: GERD, PUD, h. Pylori, pancreatitis, cholecystitis, symptomatic cholelithiasis, boerhaaves, GI bleed.    Additional history obtained:  Additional history obtained from chart review & nursing note review.  Lab Tests:  I Ordered, reviewed, and interpreted labs, which included:  CBC: No anemia or leukocytosis. CMP: Mild hyperglycemia.  LFTs and renal function preserved.  No electrolyte derangement. Lipase: WNL  ED Course:  13:00: Initial eval, mild epigastric/LUQ tenderness, no peritoneal signs. Trial zofran with GI cocktail.    13:50: RE-EVAL: Patient feeling somewhat improved status post ED interventions, he is tolerating p.o.  Repeat abdominal exam remains without peritoneal signs, do not suspect acute surgical abdomen.  Suspicion for gallbladder pathology is low given no right upper quadrant tenderness, negative Murphy's, and liver function tests are within normal limits.  Normal lipase, doubt pancreatitis.  Does not appear systemically ill feel Boerhaave syndrome is less likely.  No anemia, no BUN elevation, no reports of blood in stool, doubt acute upper GI bleed.  Will trial PPI and Carafate with PCP follow-up.  Discussed diet guidelines. I discussed results, treatment plan, need for follow-up, and return precautions with the patient. Provided opportunity for questions, patient confirmed understanding and is in agreement with plan.   Portions of  this note were generated with Scientist, clinical (histocompatibility and immunogenetics). Dictation errors may occur despite best attempts at proofreading.  Final Clinical Impression(s) / ED Diagnoses Final diagnoses:  Epigastric pain    Rx / DC Orders ED Discharge Orders         Ordered    ondansetron (ZOFRAN ODT) 4 MG disintegrating tablet  Every 8 hours PRN     03/14/20 1355    omeprazole (PRILOSEC) 20 MG capsule  Daily     03/14/20 1355    sucralfate (CARAFATE) 1 GM/10ML suspension  3 times daily with meals & bedtime     03/14/20 1355           Chandi Nicklin, [place] R, PA-C 03/14/20 1356    Gerhard Munch, MD 03/15/20 1305

## 2020-03-14 NOTE — ED Notes (Signed)
Pt discharge instructions reviewed with the patient. The patient verbalized understanding of instructions. Pt discharged. 

## 2020-03-14 NOTE — Discharge Instructions (Addendum)
You were seen in the emergency department today for abdominal pain.  Your labs were overall reassuring.  We are sending you home with the following medicine stop with your symptoms: -Omeprazole: Take once prior to any meals of the day to help with stomach acidity -Carafate: Take prior to meals and prior to bedtime to help with stomach acidity -Zofran: Take every 8 hours as needed for nausea and vomiting.  We have prescribed you new medication(s) today. Discuss the medications prescribed today with your pharmacist as they can have adverse effects and interactions with your other medicines including over the counter and prescribed medications. Seek medical evaluation if you start to experience new or abnormal symptoms after taking one of these medicines, seek care immediately if you start to experience difficulty breathing, feeling of your throat closing, facial swelling, or rash as these could be indications of a more serious allergic reaction  Please follow attached diet guidelines.  Please follow-up with your primary care provider within 3 to 5 days for reevaluation.  Return to the ER for new or worsening symptoms including but not limited to worsening pain, pain in different location, fever, blood in your vomit or stool, inability to keep fluids down, trouble breathing, or any other concern.

## 2020-03-14 NOTE — ED Triage Notes (Signed)
Patient arrives to ED with complaints of epigastric abdominal pain for the last couple of months. Patient states that the pain comes and goes. Patient states he gets nauseous from time to time. Hx IBS.

## 2020-07-17 ENCOUNTER — Other Ambulatory Visit: Payer: Self-pay

## 2020-07-17 ENCOUNTER — Ambulatory Visit
Admission: EM | Admit: 2020-07-17 | Discharge: 2020-07-17 | Disposition: A | Discharge status: Home / Self Care | Payer: Managed Care, Other (non HMO) | Attending: Physician Assistant | Admitting: Physician Assistant

## 2020-07-17 DIAGNOSIS — R059 Cough, unspecified: Secondary | ICD-10-CM

## 2020-07-17 DIAGNOSIS — R0981 Nasal congestion: Secondary | ICD-10-CM

## 2020-07-17 DIAGNOSIS — Z1152 Encounter for screening for COVID-19: Secondary | ICD-10-CM

## 2020-07-17 NOTE — Discharge Instructions (Signed)
COVID PCR testing ordered. I would like you to quarantine until testing results. You can take over the counter flonase/nasacort to help with nasal congestion/drainage. Tylenol/motrin for pain and fever. Keep hydrated, urine should be clear to pale yellow in color. If experiencing shortness of breath, trouble breathing, go to the emergency department for further evaluation needed.  

## 2020-07-17 NOTE — ED Triage Notes (Signed)
Pt c/o cough, runny nose, headache, and scratchy throat since yesterday. States had a positive covid exposure from work last week.

## 2020-07-17 NOTE — ED Provider Notes (Signed)
[name]    CSN: [id number] Arrival date & time: 07/17/20  1127      History   Chief Complaint Chief Complaint  Patient presents with  . Cough    HPI Dylan Douglas is a 29 y.o. male.   29 year old male comes in for 2 day of URI symptoms. Cough, rhinorrhea, headache, throat irritation. Denies fever, chills, body aches. Denies abdominal pain, nausea, vomiting, diarrhea. Denies loss of taste/smell. Has baseline shortness of breath due to anxiety, no changes. Former smoker. Positive COVID exposure 1 week ago.     Past Medical History:  Diagnosis Date  . IBS (irritable bowel syndrome)   . Palpitations     Patient Active Problem List   Diagnosis Date Noted  . IBS (irritable bowel syndrome) 05/27/2015  . Palpitations 07/31/2013    History reviewed. No pertinent surgical history.     Home Medications    Prior to Admission medications   Medication Sig Start Date End Date Taking? Authorizing Provider  meclizine (ANTIVERT) 12.5 MG tablet Take 1 tablet (12.5 mg total) by mouth 3 (three) times daily as needed for dizziness. 07/20/19   Hall-Potvin, [place], PA-C  omeprazole (PRILOSEC) 20 MG capsule Take 1 capsule (20 mg total) by mouth daily. 03/14/20   Petrucelli, Samantha R, PA-C  ondansetron (ZOFRAN ODT) 4 MG disintegrating tablet Take 1 tablet (4 mg total) by mouth every 8 (eight) hours as needed for nausea or vomiting. 03/14/20   Petrucelli, Samantha R, PA-C  dicyclomine (BENTYL) 20 MG tablet Take 1 tablet (20 mg total) by mouth 2 (two) times daily. Patient not taking: Reported on 04/30/2018 05/19/16 07/13/19  Gerhard Munch, MD  metoCLOPramide (REGLAN) 10 MG tablet Take 1 tablet (10 mg total) by mouth 3 (three) times daily as needed for nausea (headache / nausea). Patient not taking: Reported on 04/30/2018 02/06/16 07/13/19  Eber Hong, MD    Family History Family History  Problem Relation Age of Onset  . Breast cancer Mother   . Cancer Mother     Social  History Social History   Tobacco Use  . Smoking status: Former Smoker    Types: Cigars, Cigarettes  . Smokeless tobacco: Never Used  Substance Use Topics  . Alcohol use: Yes    Alcohol/week: 0.0 standard drinks    Comment: Occasionally  . Drug use: No     Allergies   Flagyl [metronidazole]   Review of Systems Review of Systems  Reason unable to perform ROS: See HPI as above.     Physical Exam Triage Vital Signs ED Triage Vitals  Enc Vitals Group     BP 07/17/20 1312 (!) 132/91     Pulse Rate 07/17/20 1312 67     Resp 07/17/20 1312 18     Temp 07/17/20 1312 98.4 F (36.9 C)     Temp Source 07/17/20 1312 Oral     SpO2 07/17/20 1312 96 %     Weight --      Height --      Head Circumference --      Peak Flow --      Pain Score 07/17/20 1313 0     Pain Loc --      Pain Edu? --      Excl. in GC? --    No data found.  Updated Vital Signs BP (!) 132/91 (BP Location: Left Arm)   Pulse 67   Temp 98.4 F (36.9 C) (Oral)   Resp 18  SpO2 96%   Physical Exam Constitutional:      General: He is not in acute distress.    Appearance: Normal appearance. He is not ill-appearing, toxic-appearing or diaphoretic.  HENT:     Head: Normocephalic and atraumatic.     Mouth/Throat:     Mouth: Mucous membranes are moist.     Pharynx: Oropharynx is clear. Uvula midline.  Cardiovascular:     Rate and Rhythm: Normal rate and regular rhythm.     Heart sounds: Normal heart sounds. No murmur heard.  No friction rub. No gallop.   Pulmonary:     Effort: Pulmonary effort is normal. No accessory muscle usage, prolonged expiration, respiratory distress or retractions.     Comments: Lungs clear to auscultation without adventitious lung sounds. Musculoskeletal:     Cervical back: Normal range of motion and neck supple.  Neurological:     General: No focal deficit present.     Mental Status: He is alert and oriented to person, place, and time.      UC Treatments / Results    Labs (all labs ordered are listed, but only abnormal results are displayed) Labs Reviewed  NOVEL CORONAVIRUS, NAA    EKG   Radiology No results found.  Procedures Procedures (including critical care time)  Medications Ordered in UC Medications - No data to display  Initial Impression / Assessment and Plan / UC Course  I have reviewed the triage vital signs and the nursing notes.  Pertinent labs & imaging results that were available during my care of the patient were reviewed by me and considered in my medical decision making (see chart for details).    COVID PCR test ordered. Patient to quarantine until testing results return. No alarming signs on exam. LCTAB. Symptomatic treatment discussed.  Push fluids.  Return precautions given.  Patient expresses understanding and agrees to plan.  Final Clinical Impressions(s) / UC Diagnoses   Final diagnoses:  Encounter for screening for COVID-19  Nasal congestion  Cough   ED Prescriptions    None     PDMP not reviewed this encounter.   Belinda Fisher, PA-C 07/17/20 1335

## 2020-07-19 LAB — SARS-COV-2, NAA 2 DAY TAT

## 2020-07-19 LAB — NOVEL CORONAVIRUS, NAA: SARS-CoV-2, NAA: NOT DETECTED

## 2020-07-21 ENCOUNTER — Ambulatory Visit
Admission: EM | Admit: 2020-07-21 | Discharge: 2020-07-21 | Disposition: A | Discharge status: Home / Self Care | Payer: Managed Care, Other (non HMO) | Attending: Physician Assistant | Admitting: Physician Assistant

## 2020-07-21 ENCOUNTER — Other Ambulatory Visit: Payer: Self-pay

## 2020-07-21 DIAGNOSIS — Z20822 Contact with and (suspected) exposure to covid-19: Secondary | ICD-10-CM

## 2020-07-21 MED ORDER — ONDANSETRON 4 MG PO TBDP: 4.0000 mg | ORAL_TABLET | Freq: Three times a day (TID) | ORAL | 0 refills | Status: DC | PRN

## 2020-07-21 NOTE — ED Triage Notes (Signed)
Pt states had a neg covid test here on 07/17/20 from an exposure, states the next day developed cough, nasal congestion, backache, headache, fever, fatigue, and nausea.

## 2020-07-21 NOTE — ED Provider Notes (Signed)
[name]    CSN: [id number] Arrival date & time: 07/21/20  1235      History   Chief Complaint Chief Complaint  Patient presents with  . Fatigue    HPI Dylan Douglas is a 29 y.o. male.   29 year old male comes in for worsening symptoms after being seen 07/17/2020. At the time, he had positive COVID contact with rhinorrhea, cough, throat irritation, headache. COVID testing negative. However, since then states has developed fever and loss of taste/smell. tmax 102, responsive to antipyretic. Nausea without vomiting. Denies diarrhea. Feels occasional shortness of breath.     Past Medical History:  Diagnosis Date  . IBS (irritable bowel syndrome)   . Palpitations     Patient Active Problem List   Diagnosis Date Noted  . IBS (irritable bowel syndrome) 05/27/2015  . Palpitations 07/31/2013    History reviewed. No pertinent surgical history.     Home Medications    Prior to Admission medications   Medication Sig Start Date End Date Taking? Authorizing Provider  ondansetron (ZOFRAN ODT) 4 MG disintegrating tablet Take 1 tablet (4 mg total) by mouth every 8 (eight) hours as needed for nausea or vomiting. 07/21/20   Cathie Hoops, Maliya Marich V, PA-C  dicyclomine (BENTYL) 20 MG tablet Take 1 tablet (20 mg total) by mouth 2 (two) times daily. Patient not taking: Reported on 04/30/2018 05/19/16 07/13/19  Gerhard Munch, MD  metoCLOPramide (REGLAN) 10 MG tablet Take 1 tablet (10 mg total) by mouth 3 (three) times daily as needed for nausea (headache / nausea). Patient not taking: Reported on 04/30/2018 02/06/16 07/13/19  Eber Hong, MD    Family History Family History  Problem Relation Age of Onset  . Breast cancer Mother   . Cancer Mother     Social History Social History   Tobacco Use  . Smoking status: Former Smoker    Types: Cigars, Cigarettes  . Smokeless tobacco: Never Used  Substance Use Topics  . Alcohol use: Yes    Alcohol/week: 0.0 standard drinks    Comment:  Occasionally  . Drug use: No     Allergies   Flagyl [metronidazole]   Review of Systems Review of Systems  Reason unable to perform ROS: See HPI as above.     Physical Exam Triage Vital Signs ED Triage Vitals [07/21/20 1323]  Enc Vitals Group     BP 131/88     Pulse Rate (!) 102     Resp 18     Temp 100.2 F (37.9 C)     Temp Source Oral     SpO2 95 %     Weight      Height      Head Circumference      Peak Flow      Pain Score 10     Pain Loc      Pain Edu?      Excl. in GC?    No data found.  Updated Vital Signs BP 131/88 (BP Location: Left Arm)   Pulse (!) 102   Temp 100.2 F (37.9 C) (Oral)   Resp 18   SpO2 95%   Physical Exam Constitutional:      General: He is not in acute distress.    Appearance: Normal appearance. He is not ill-appearing, toxic-appearing or diaphoretic.  HENT:     Head: Normocephalic and atraumatic.  Cardiovascular:     Rate and Rhythm: Normal rate and regular rhythm.     Heart sounds:  Normal heart sounds. No murmur heard.  No friction rub. No gallop.      Comments: Tachycardia resolved Pulmonary:     Effort: Pulmonary effort is normal. No accessory muscle usage, prolonged expiration, respiratory distress or retractions.     Comments: Lungs clear to auscultation without adventitious lung sounds. Musculoskeletal:     Cervical back: Normal range of motion and neck supple.  Skin:    General: Skin is warm and dry.  Neurological:     General: No focal deficit present.     Mental Status: He is alert and oriented to person, place, and time.      UC Treatments / Results  Labs (all labs ordered are listed, but only abnormal results are displayed) Labs Reviewed  NOVEL CORONAVIRUS, NAA    EKG   Radiology No results found.  Procedures Procedures (including critical care time)  Medications Ordered in UC Medications - No data to display  Initial Impression / Assessment and Plan / UC Course  I have reviewed the triage  vital signs and the nursing notes.  Pertinent labs & imaging results that were available during my care of the patient were reviewed by me and considered in my medical decision making (see chart for details).    Discussed suspect COVID and to quarantine regardless of results. LCTAB.  Will provide symptomatic treatment at this time. Return precautions given.   Final Clinical Impressions(s) / UC Diagnoses   Final diagnoses:  Suspected COVID-19 virus infection    ED Prescriptions    Medication Sig Dispense Auth. Provider   ondansetron (ZOFRAN ODT) 4 MG disintegrating tablet Take 1 tablet (4 mg total) by mouth every 8 (eight) hours as needed for nausea or vomiting. 20 tablet Belinda Fisher, PA-C     PDMP not reviewed this encounter.   Belinda Fisher, PA-C 07/21/20 1341

## 2020-07-21 NOTE — Discharge Instructions (Addendum)
COVID PCR testing ordered. As discussed, given your exposure and symptoms, I would like you to quarantine as if you are positive for COVID regardless of symptoms.  You can discontinue quarantine after 10 days of symptom onset AND 24 hours of no fever with improvement of symptoms.   Zofran as needed for nausea/vomiting. Tylenol/motrin for pain and fever. Keep hydrated, urine should be clear to pale yellow in color. If experiencing shortness of breath, trouble breathing, go to the emergency department for further evaluation needed.

## 2020-07-23 LAB — NOVEL CORONAVIRUS, NAA: SARS-CoV-2, NAA: DETECTED — AB

## 2020-07-23 LAB — SARS-COV-2, NAA 2 DAY TAT

## 2020-07-24 ENCOUNTER — Telehealth (HOSPITAL_COMMUNITY): Payer: Self-pay | Admitting: Adult Health

## 2020-07-24 NOTE — Telephone Encounter (Signed)
Called patient regarding monoclonal antibody treatment for COVID 19 given to those who are at risk for complications and/or hospitalization of the virus.  Patient meets criteria based on: BMI greater than 25. He would like to call someone about whether or not he should proceed with infusion.  If he decides to, then he will call us back.    Call back number given: [phone]   Lillard Anes, NP

## 2020-07-27 ENCOUNTER — Emergency Department (HOSPITAL_COMMUNITY): Payer: Managed Care, Other (non HMO)

## 2020-07-27 ENCOUNTER — Encounter (HOSPITAL_COMMUNITY): Payer: Self-pay

## 2020-07-27 ENCOUNTER — Inpatient Hospital Stay (HOSPITAL_COMMUNITY)
Admission: EM | Admit: 2020-07-27 | Discharge: 2020-07-30 | DRG: 177 | Disposition: A | Discharge status: Home / Self Care | Payer: Managed Care, Other (non HMO) | Attending: Family Medicine | Admitting: Family Medicine

## 2020-07-27 ENCOUNTER — Other Ambulatory Visit: Payer: Self-pay

## 2020-07-27 DIAGNOSIS — E876 Hypokalemia: Secondary | ICD-10-CM | POA: Diagnosis present

## 2020-07-27 DIAGNOSIS — U071 COVID-19: Secondary | ICD-10-CM | POA: Diagnosis present

## 2020-07-27 DIAGNOSIS — Z803 Family history of malignant neoplasm of breast: Secondary | ICD-10-CM | POA: Diagnosis not present

## 2020-07-27 DIAGNOSIS — R0902 Hypoxemia: Secondary | ICD-10-CM | POA: Diagnosis not present

## 2020-07-27 DIAGNOSIS — Z79899 Other long term (current) drug therapy: Secondary | ICD-10-CM | POA: Diagnosis not present

## 2020-07-27 DIAGNOSIS — K589 Irritable bowel syndrome without diarrhea: Secondary | ICD-10-CM | POA: Diagnosis present

## 2020-07-27 DIAGNOSIS — R8271 Bacteriuria: Secondary | ICD-10-CM | POA: Diagnosis present

## 2020-07-27 DIAGNOSIS — Z881 Allergy status to other antibiotic agents status: Secondary | ICD-10-CM | POA: Diagnosis not present

## 2020-07-27 DIAGNOSIS — E669 Obesity, unspecified: Secondary | ICD-10-CM | POA: Diagnosis present

## 2020-07-27 DIAGNOSIS — F17210 Nicotine dependence, cigarettes, uncomplicated: Secondary | ICD-10-CM | POA: Diagnosis present

## 2020-07-27 DIAGNOSIS — B379 Candidiasis, unspecified: Secondary | ICD-10-CM | POA: Diagnosis present

## 2020-07-27 DIAGNOSIS — J9601 Acute respiratory failure with hypoxia: Secondary | ICD-10-CM | POA: Diagnosis present

## 2020-07-27 DIAGNOSIS — Z7952 Long term (current) use of systemic steroids: Secondary | ICD-10-CM | POA: Diagnosis not present

## 2020-07-27 DIAGNOSIS — R0602 Shortness of breath: Secondary | ICD-10-CM | POA: Diagnosis present

## 2020-07-27 DIAGNOSIS — J1282 Pneumonia due to coronavirus disease 2019: Secondary | ICD-10-CM | POA: Diagnosis present

## 2020-07-27 DIAGNOSIS — A419 Sepsis, unspecified organism: Secondary | ICD-10-CM | POA: Diagnosis present

## 2020-07-27 LAB — CBC WITH DIFFERENTIAL/PLATELET
Abs Immature Granulocytes: 0.03 10*3/uL (ref 0.00–0.07)
Basophils Absolute: 0 10*3/uL (ref 0.0–0.1)
Basophils Relative: 0 %
Eosinophils Absolute: 0 10*3/uL (ref 0.0–0.5)
Eosinophils Relative: 0 %
HCT: 44.2 % (ref 39.0–52.0)
Hemoglobin: 15.7 g/dL (ref 13.0–17.0)
Immature Granulocytes: 0 %
Lymphocytes Relative: 16 %
Lymphs Abs: 1.3 10*3/uL (ref 0.7–4.0)
MCH: 30.8 pg (ref 26.0–34.0)
MCHC: 35.5 g/dL (ref 30.0–36.0)
MCV: 86.7 fL (ref 80.0–100.0)
Monocytes Absolute: 0.7 10*3/uL (ref 0.1–1.0)
Monocytes Relative: 8 %
Neutro Abs: 6.5 10*3/uL (ref 1.7–7.7)
Neutrophils Relative %: 76 %
Platelets: 264 10*3/uL (ref 150–400)
RBC: 5.1 MIL/uL (ref 4.22–5.81)
RDW: 12.7 % (ref 11.5–15.5)
WBC: 8.6 10*3/uL (ref 4.0–10.5)
nRBC: 0 % (ref 0.0–0.2)

## 2020-07-27 LAB — CBC
HCT: 45.9 % (ref 39.0–52.0)
Hemoglobin: 15.7 g/dL (ref 13.0–17.0)
MCH: 29.7 pg (ref 26.0–34.0)
MCHC: 34.2 g/dL (ref 30.0–36.0)
MCV: 86.8 fL (ref 80.0–100.0)
Platelets: 254 10*3/uL (ref 150–400)
RBC: 5.29 MIL/uL (ref 4.22–5.81)
RDW: 13 % (ref 11.5–15.5)
WBC: 7.9 10*3/uL (ref 4.0–10.5)
nRBC: 0 % (ref 0.0–0.2)

## 2020-07-27 LAB — URINALYSIS, ROUTINE W REFLEX MICROSCOPIC
Bilirubin Urine: NEGATIVE
Glucose, UA: NEGATIVE mg/dL
Hgb urine dipstick: NEGATIVE
Ketones, ur: 5 mg/dL — AB
Leukocytes,Ua: NEGATIVE
Nitrite: NEGATIVE
Protein, ur: 30 mg/dL — AB
Specific Gravity, Urine: 1.032 — ABNORMAL HIGH (ref 1.005–1.030)
pH: 6 (ref 5.0–8.0)

## 2020-07-27 LAB — COMPREHENSIVE METABOLIC PANEL
ALT: 36 U/L (ref 0–44)
AST: 42 U/L — ABNORMAL HIGH (ref 15–41)
Albumin: 3.5 g/dL (ref 3.5–5.0)
Alkaline Phosphatase: 28 U/L — ABNORMAL LOW (ref 38–126)
Anion gap: 14 (ref 5–15)
BUN: 15 mg/dL (ref 6–20)
CO2: 25 mmol/L (ref 22–32)
Calcium: 8.4 mg/dL — ABNORMAL LOW (ref 8.9–10.3)
Chloride: 102 mmol/L (ref 98–111)
Creatinine, Ser: 1.14 mg/dL (ref 0.61–1.24)
GFR calc Af Amer: >60 mL/min (ref >60)
GFR calc non Af Amer: >60 mL/min (ref >60)
Glucose, Bld: 107 mg/dL — ABNORMAL HIGH (ref 70–99)
Potassium: 3.4 mmol/L — ABNORMAL LOW (ref 3.5–5.1)
Sodium: 141 mmol/L (ref 135–145)
Total Bilirubin: 0.7 mg/dL (ref 0.3–1.2)
Total Protein: 6.8 g/dL (ref 6.5–8.1)

## 2020-07-27 LAB — LACTATE DEHYDROGENASE: LDH: 308 U/L — ABNORMAL HIGH (ref 98–192)

## 2020-07-27 LAB — CREATININE, SERUM
Creatinine, Ser: 1.13 mg/dL (ref 0.61–1.24)
GFR calc Af Amer: >60 mL/min (ref >60)
GFR calc non Af Amer: >60 mL/min (ref >60)

## 2020-07-27 LAB — D-DIMER, QUANTITATIVE: D-Dimer, Quant: 0.3 ug/mL-FEU (ref 0.00–0.50)

## 2020-07-27 LAB — MAGNESIUM: Magnesium: 2.1 mg/dL (ref 1.7–2.4)

## 2020-07-27 LAB — HEPATITIS B SURFACE ANTIGEN: Hepatitis B Surface Ag: NONREACTIVE

## 2020-07-27 LAB — FERRITIN: Ferritin: 1281 ng/mL — ABNORMAL HIGH (ref 24–336)

## 2020-07-27 LAB — PROCALCITONIN: Procalcitonin: 0.11 ng/mL

## 2020-07-27 LAB — TROPONIN I (HIGH SENSITIVITY): Troponin I (High Sensitivity): 8 ng/L (ref <18)

## 2020-07-27 LAB — FIBRINOGEN: Fibrinogen: 623 mg/dL — ABNORMAL HIGH (ref 210–475)

## 2020-07-27 LAB — HIV ANTIBODY (ROUTINE TESTING W REFLEX): HIV Screen 4th Generation wRfx: NONREACTIVE

## 2020-07-27 LAB — LACTIC ACID, PLASMA: Lactic Acid, Venous: 1.3 mmol/L (ref 0.5–1.9)

## 2020-07-27 LAB — C-REACTIVE PROTEIN: CRP: 5 mg/dL — ABNORMAL HIGH (ref <1.0)

## 2020-07-27 LAB — BRAIN NATRIURETIC PEPTIDE: B Natriuretic Peptide: 18.3 pg/mL (ref 0.0–100.0)

## 2020-07-27 MED ORDER — LOPERAMIDE HCL 2 MG PO CAPS: 2.0000 mg | ORAL_CAPSULE | ORAL | Status: DC | PRN

## 2020-07-27 MED ORDER — ENOXAPARIN SODIUM 40 MG/0.4ML ~~LOC~~ SOLN
40.0000 mg | SUBCUTANEOUS | Status: DC
  Administered 2020-07-27 – 2020-07-29 (×3): 40 mg via SUBCUTANEOUS
  Filled 2020-07-27 (×3): qty 0.4

## 2020-07-27 MED ORDER — POTASSIUM CHLORIDE CRYS ER 20 MEQ PO TBCR
40.0000 meq | EXTENDED_RELEASE_TABLET | Freq: Once | ORAL | Status: AC
  Administered 2020-07-27: 40 meq via ORAL
  Filled 2020-07-27: qty 2

## 2020-07-27 MED ORDER — ACETAMINOPHEN 500 MG PO TABS
1000.0000 mg | ORAL_TABLET | Freq: Once | ORAL | Status: AC
  Administered 2020-07-27: 1000 mg via ORAL
  Filled 2020-07-27: qty 2

## 2020-07-27 MED ORDER — ACETAMINOPHEN 325 MG PO TABS: 650.0000 mg | ORAL_TABLET | Freq: Once | ORAL | Status: DC | PRN

## 2020-07-27 MED ORDER — IOHEXOL 350 MG/ML SOLN
75.0000 mL | Freq: Once | INTRAVENOUS | Status: AC | PRN
  Administered 2020-07-27: 75 mL via INTRAVENOUS

## 2020-07-27 MED ORDER — SODIUM CHLORIDE 0.9 % IV SOLN
100.0000 mg | Freq: Every day | INTRAVENOUS | Status: DC
  Administered 2020-07-28 – 2020-07-30 (×3): 100 mg via INTRAVENOUS
  Filled 2020-07-27 (×4): qty 20

## 2020-07-27 MED ORDER — GUAIFENESIN-DM 100-10 MG/5ML PO SYRP: 10.0000 mL | ORAL_SOLUTION | ORAL | Status: DC | PRN

## 2020-07-27 MED ORDER — SODIUM CHLORIDE 0.9 % IV SOLN: Freq: Once | INTRAVENOUS | Status: AC

## 2020-07-27 MED ORDER — ASCORBIC ACID 500 MG PO TABS
500.0000 mg | ORAL_TABLET | Freq: Every day | ORAL | Status: DC
  Administered 2020-07-27 – 2020-07-30 (×4): 500 mg via ORAL
  Filled 2020-07-27 (×4): qty 1

## 2020-07-27 MED ORDER — ZINC SULFATE 220 (50 ZN) MG PO CAPS
220.0000 mg | ORAL_CAPSULE | Freq: Every day | ORAL | Status: DC
  Administered 2020-07-27 – 2020-07-30 (×4): 220 mg via ORAL
  Filled 2020-07-27 (×4): qty 1

## 2020-07-27 MED ORDER — ONDANSETRON HCL 4 MG PO TABS: 4.0000 mg | ORAL_TABLET | Freq: Four times a day (QID) | ORAL | Status: DC | PRN

## 2020-07-27 MED ORDER — ACETAMINOPHEN 325 MG PO TABS
650.0000 mg | ORAL_TABLET | Freq: Four times a day (QID) | ORAL | Status: DC | PRN
  Administered 2020-07-29: 650 mg via ORAL
  Filled 2020-07-27: qty 2

## 2020-07-27 MED ORDER — ALBUTEROL SULFATE HFA 108 (90 BASE) MCG/ACT IN AERS
2.0000 | INHALATION_SPRAY | Freq: Four times a day (QID) | RESPIRATORY_TRACT | Status: DC
  Administered 2020-07-27 – 2020-07-30 (×12): 2 via RESPIRATORY_TRACT
  Filled 2020-07-27 (×2): qty 6.7

## 2020-07-27 MED ORDER — SODIUM CHLORIDE 0.9 % IV SOLN
200.0000 mg | Freq: Once | INTRAVENOUS | Status: AC
  Administered 2020-07-27: 200 mg via INTRAVENOUS
  Filled 2020-07-27: qty 40

## 2020-07-27 MED ORDER — METHYLPREDNISOLONE SODIUM SUCC 125 MG IJ SOLR
1.0000 mg/kg | Freq: Two times a day (BID) | INTRAMUSCULAR | Status: AC
  Administered 2020-07-27 – 2020-07-30 (×6): 92.5 mg via INTRAVENOUS
  Filled 2020-07-27 (×7): qty 2

## 2020-07-27 MED ORDER — HYDROCOD POLST-CPM POLST ER 10-8 MG/5ML PO SUER: 5.0000 mL | Freq: Two times a day (BID) | ORAL | Status: DC | PRN

## 2020-07-27 MED ORDER — NYSTATIN 100000 UNIT/ML MT SUSP
5.0000 mL | Freq: Three times a day (TID) | OROMUCOSAL | Status: DC
  Administered 2020-07-27 – 2020-07-30 (×8): 500000 [IU] via ORAL
  Filled 2020-07-27 (×9): qty 5

## 2020-07-27 MED ORDER — PREDNISONE 20 MG PO TABS: 50.0000 mg | ORAL_TABLET | Freq: Every day | ORAL | Status: DC

## 2020-07-27 MED ORDER — ONDANSETRON HCL 4 MG/2ML IJ SOLN: 4.0000 mg | Freq: Four times a day (QID) | INTRAMUSCULAR | Status: DC | PRN

## 2020-07-27 NOTE — ED Provider Notes (Signed)
[facility] EMERGENCY DEPARTMENT Provider Note   CSN: [id number] Arrival date & time: 07/27/20  3818     History No chief complaint on file.   Dylan Douglas is a 30 y.o. male.  HPI Patient is a 29 year old male with a history of IBS who presents to the emergency department due to worsening symptoms related to COVID-19.  Patient states that he was diagnosed with COVID-19 about 6 days ago.  He has been symptomatic for about 10 days.  He reports fevers, chills, body aches, fatigue, shortness of breath, intermittent nausea, intermittent diarrhea, sore throat, loss of taste/smell.  He states that he has been evaluated for this multiple times.  He is taking Zofran and promethazine for his intermittent nausea.  He states that Zofran was not providing relief so he was then prescribed promethazine which is providing some mild relief.  He has been taking 40 mg of prednisone daily for the past 2 days.  Also notes that he began experiencing symptoms related to thrush about 4 days ago.  He was prescribed nystatin for this but "has only used it a couple of times" and has had little relief.  Denies any chest pain, abdominal pain, constipation, syncope.  Patient has not been vaccinated for COVID-19.     Past Medical History:  Diagnosis Date  . IBS (irritable bowel syndrome)   . Palpitations     Patient Active Problem List   Diagnosis Date Noted  . IBS (irritable bowel syndrome) 05/27/2015  . Palpitations 07/31/2013    History reviewed. No pertinent surgical history.     Family History  Problem Relation Age of Onset  . Breast cancer Mother   . Cancer Mother     Social History   Tobacco Use  . Smoking status: Former Smoker    Types: Cigars, Cigarettes  . Smokeless tobacco: Never Used  Substance Use Topics  . Alcohol use: Yes    Alcohol/week: 0.0 standard drinks    Comment: Occasionally  . Drug use: No    Home Medications Prior to Admission medications     Medication Sig Start Date End Date Taking? Authorizing Provider  ondansetron (ZOFRAN ODT) 4 MG disintegrating tablet Take 1 tablet (4 mg total) by mouth every 8 (eight) hours as needed for nausea or vomiting. 07/21/20   Cathie Hoops, Amy V, PA-C  dicyclomine (BENTYL) 20 MG tablet Take 1 tablet (20 mg total) by mouth 2 (two) times daily. Patient not taking: Reported on 04/30/2018 05/19/16 07/13/19  Gerhard Munch, MD  metoCLOPramide (REGLAN) 10 MG tablet Take 1 tablet (10 mg total) by mouth 3 (three) times daily as needed for nausea (headache / nausea). Patient not taking: Reported on 04/30/2018 02/06/16 07/13/19  Eber Hong, MD    Allergies    Flagyl [metronidazole]  Review of Systems   Review of Systems  All other systems reviewed and are negative. Ten systems reviewed and are negative for acute change, except as noted in the HPI.   Physical Exam Updated Vital Signs BP (!) 138/97 (BP Location: Right Arm)   Pulse (!) 118   Temp (!) 101.5 F (38.6 C) (Oral)   Resp (!) 22   Ht 5\' 10"  (1.778 m)   Wt 92.5 kg   SpO2 91%   BMI 29.27 kg/m   Physical Exam Vitals and nursing note reviewed.  Constitutional:      General: He is in acute distress.     Appearance: Normal appearance. He is normal weight. He is not  toxic-appearing or diaphoretic.  HENT:     Head: Normocephalic and atraumatic.     Right Ear: External ear normal.     Left Ear: External ear normal.     Nose: Nose normal.     Mouth/Throat:     Mouth: Mucous membranes are dry.     Pharynx: Oropharynx is clear. Posterior oropharyngeal erythema present. No oropharyngeal exudate.     Comments: White patches noted along tongue, consistent with thrush.  Diffuse erythema noted in the posterior oropharynx.  No tonsillar hypertrophy.  Uvula midline.  No exudates.  Patient readily handling secretions. Eyes:     Extraocular Movements: Extraocular movements intact.  Cardiovascular:     Rate and Rhythm: Regular rhythm. Tachycardia present.      Pulses: Normal pulses.     Heart sounds: Normal heart sounds. No murmur heard.  No friction rub. No gallop.   Pulmonary:     Effort: Pulmonary effort is normal. No respiratory distress.     Breath sounds: No stridor. Rales present. No wheezing or rhonchi.     Comments: Crackles noted in the bilateral lung bases, right greater than left. Abdominal:     General: Abdomen is flat.     Tenderness: There is no abdominal tenderness.  Musculoskeletal:        General: Normal range of motion.     Cervical back: Normal range of motion and neck supple. No tenderness.     Right lower leg: No edema.     Left lower leg: No edema.     Comments: No leg swelling or calf pain.  Skin:    General: Skin is warm and dry.  Neurological:     General: No focal deficit present.     Mental Status: He is alert and oriented to person, place, and time.  Psychiatric:        Mood and Affect: Mood normal.        Behavior: Behavior normal.    ED Results / Procedures / Treatments   Labs (all labs ordered are listed, but only abnormal results are displayed) Labs Reviewed  COMPREHENSIVE METABOLIC PANEL - Abnormal; Notable for the following components:      Result Value   Potassium 3.4 (*)    Glucose, Bld 107 (*)    Calcium 8.4 (*)    AST 42 (*)    Alkaline Phosphatase 28 (*)    All other components within normal limits  URINALYSIS, ROUTINE W REFLEX MICROSCOPIC - Abnormal; Notable for the following components:   APPearance HAZY (*)    Specific Gravity, Urine 1.032 (*)    Ketones, ur 5 (*)    Protein, ur 30 (*)    Bacteria, UA RARE (*)    All other components within normal limits  LACTIC ACID, PLASMA  CBC WITH DIFFERENTIAL/PLATELET   EKG None  Radiology CT Angio Chest PE W and/or Wo Contrast  Result Date: 07/27/2020 CLINICAL DATA:  High probability of pulmonary embolus. Shortness of breath. Patient tested positive for COVID-19 July 21, 2020. EXAM: CT ANGIOGRAPHY CHEST WITH CONTRAST TECHNIQUE:  Multidetector CT imaging of the chest was performed using the standard protocol during bolus administration of intravenous contrast. Multiplanar CT image reconstructions and MIPs were obtained to evaluate the vascular anatomy. CONTRAST:  8mL OMNIPAQUE IOHEXOL 350 MG/ML SOLN COMPARISON:  Chest x-ray July 27, 2020 FINDINGS: Cardiovascular: Satisfactory opacification of the pulmonary arteries to the segmental level. No evidence of pulmonary embolism. Normal heart size. No pericardial effusion. Mediastinum/Nodes: No enlarged  mediastinal, hilar, or axillary lymph nodes. Thyroid gland, trachea, and esophagus demonstrate no significant findings. Lungs/Pleura: Central airways are normal. No pneumothorax. No mediastinal air. Patchy bilateral pulmonary infiltrates, particularly in the peripheries of the lungs and most prominent the bases is consistent with multifocal pneumonia. Upper Abdomen: No acute abnormality. Musculoskeletal: No chest wall abnormality. No acute or significant osseous findings. Review of the MIP images confirms the above findings. IMPRESSION: 1. Multifocal COVID-19 pneumonia. 2. No pulmonary emboli. Electronically Signed   By: Gerome Sam III M.D   On: 07/27/2020 14:05   DG Chest Portable 1 View  Result Date: 07/27/2020 CLINICAL DATA:  COVID-19 infection. EXAM: PORTABLE CHEST 1 VIEW COMPARISON:  Chest radiograph-02/05/2016 FINDINGS: Apparent enlargement of the cardiac silhouette is favored to be secondary to AP projection and decreased lung volumes. Interval development of peripheral and basilar predominant interstitial and airspace opacities. No pleural effusion or pneumothorax. No evidence of edema. No acute osseous abnormalities. IMPRESSION: Bilateral mid and lower lung interstitial and airspace opacities, nonspecific though compatible with provided history of COVID-19 infection Electronically Signed   By: Simonne Come M.D.   On: 07/27/2020 10:00   Procedures Procedures (including  critical care time)  Medications Ordered in ED Medications  acetaminophen (TYLENOL) tablet 1,000 mg (1,000 mg Oral Given 07/27/20 1238)  potassium chloride SA (KLOR-CON) CR tablet 40 mEq (40 mEq Oral Given 07/27/20 1238)  0.9 %  sodium chloride infusion ( Intravenous New Bag/Given 07/27/20 1348)  iohexol (OMNIPAQUE) 350 MG/ML injection 75 mL (75 mLs Intravenous Contrast Given 07/27/20 1343)   ED Course  I have reviewed the triage vital signs and the nursing notes.  Pertinent labs & imaging results that were available during my care of the patient were reviewed by me and considered in my medical decision making (see chart for details).  Clinical Course as of Jul 28 1439  Sun Jul 27, 2020  1130 Will replete with Klor-Con  Potassium(!): 3.4 [LJ]  1131 Will give APAP  Temp(!): 101.5 F (38.6 C) [LJ]  1131 Bilateral mid and lower lung interstitial and airspace opacities, nonspecific though compatible with provided history of COVID-19 infection  DG Chest Portable 1 View [LJ]  1212 Lactic Acid, Venous: 1.3 [LJ]  1330 Patient was ambulated in the room and his pulse ox was monitored.  Nursing staff notes that he desaturated to about 86% and was tachypneic in the 30s.  She states the patient was diaphoretic and tremulous.   [LJ]  1408 Multifocal COVID-19 without any signs of PE.  CT Angio Chest PE W and/or Wo Contrast [LJ]    Clinical Course User Index [LJ] Placido Sou, PA-C   MDM Rules/Calculators/A&P                          Pt is a 29 y.o. male that presents with a history, physical exam, and ED Clinical Course as noted above.   Patient presents today with a multitude of symptoms related to COVID-19.  He has not been vaccinated for COVID-19 and tested positive on September 13.  Patient states that he has been experiencing worsening shortness of breath.  Patient tachypneic as well as tachycardic on arrival.  I obtained a CTA of the chest to rule out PE.  This was significant for  obvious COVID-19 pneumonia but no signs of PE.  Patient started on 2 L of oxygen via nasal cannula and has been saturating above 95%.  He was ambulated by nursing staff  noted to be hypoxic to 86% when off of oxygen.  He was tachypneic in the 30s and diaphoretic.  Discussed admission with the patient.  He is amenable with this plan.  Will discuss with hospitalist team for likely admission.  Note: Portions of this report may have been transcribed using voice recognition software. Every effort was made to ensure accuracy; however, inadvertent computerized transcription errors may be present.   Final Clinical Impression(s) / ED Diagnoses Final diagnoses:  COVID-19  Hypoxia   Rx / DC Orders ED Discharge Orders    None       Placido SouJoldersma, Cailynn Bodnar, PA-C 07/27/20 1441    Sabas SousBero, Michael M, MD 07/28/20 [phone]

## 2020-07-27 NOTE — Progress Notes (Signed)
Patient admitted to 101w32. Alert and oriented x 4. Currently on 4L.

## 2020-07-27 NOTE — ED Notes (Signed)
Pt ambulated in room, SpO2 dropped to 86% on room air and pt respirations increased to 32. RN aware.

## 2020-07-27 NOTE — ED Notes (Signed)
Report attempted 

## 2020-07-27 NOTE — ED Triage Notes (Signed)
Patient arrived by [facility] for ongoing SOB and fatigue related to covid with positive test 9/13. Patient alert and oriented, complains of general weakness

## 2020-07-27 NOTE — H&P (Signed)
History and Physical    Dylan Douglas XLK:[id number] DOB: [birth year] DOA: 07/27/2020  PCP: [facility] [place]  Patient coming from: home  I have personally briefly reviewed patient's old medical records in [facility] Link  Chief Complaint: Cough, shortness of breath since 10 days, tested positive for COVID-19 on 9/13.  HPI: Dylan Douglas is a 29 y.o. male with medical history significant of IBS, tobacco use, obesity presents to emergency department with worsening shortness of breath, cough since 10 days.  He tested positive for COVID-19 on 9/13.   Patient reports that he is not vaccinated against COVID-19.  Reports cough, shortness of breath, generalized weakness, lethargic, decreased appetite, loose stool, nausea, loss of sense of taste and smell.  Has been taking Zofran, promethazine for intermittent nausea with little to no relief.  He was also prescribed prednisone 40 mg daily which he did not started taking yet.  He developed thrush about 5 days ago and was prescribed nystatin.  Denies headache, blurry vision, lightheadedness, dizziness, chest pain, wheezing, abdominal pain, urinary changes.  He smokes 1 to 2 cigarettes/week, drinks alcohol occasionally however denies illicit drug use.  No history of recent antibiotic use, travel, leg swelling, history of DVT/PE.  ED Course: Upon arrival to ED: Patient had fever of 101.5, tachycardic, tachypneic, hypoxic requiring 2 L of oxygen via nasal cannula, initial labs such as CBC, lactic acid: WNL, CMP shows potassium of 3.4, UA positive for bacteria, CT chest was obtained which came back negative for PE, shows multifocal COVID-19 pneumonia.  Patient was given Tylenol  & Kcl in ED.  [facility] hospitalist consulted for admission for acute hypoxemic respiratory failure secondary to COVID-19 pneumonia.  Review of Systems: As per HPI otherwise negative.    Past Medical History:  Diagnosis Date  . IBS (irritable bowel syndrome)    . Palpitations     History reviewed. No pertinent surgical history.   reports that he has quit smoking. His smoking use included cigars and cigarettes. He has never used smokeless tobacco. He reports current alcohol use. He reports that he does not use drugs.  Allergies  Allergen Reactions  . Flagyl [Metronidazole] Anxiety    Family History  Problem Relation Age of Onset  . Breast cancer Mother   . Cancer Mother     Prior to Admission medications   Medication Sig Start Date End Date Taking? Authorizing Provider  acetaminophen (TYLENOL) 500 MG tablet Take 500-1,000 mg by mouth every 6 (six) hours as needed for mild pain, fever or headache.   Yes [provider]  albuterol (PROAIR HFA) 108 (90 Base) MCG/ACT inhaler Inhale 2 puffs into the lungs See admin instructions. Inhale 2 puffs into the lungs every 4-6 hours as needed for shortness of breath or wheezing   Yes [provider]  albuterol (PROVENTIL) (2.5 MG/3ML) 0.083% nebulizer solution Take 2.5 mg by nebulization every 6 (six) hours as needed for wheezing or shortness of breath.   Yes [provider]  ibuprofen (ADVIL) 200 MG tablet Take 200-400 mg by mouth every 6 (six) hours as needed for fever or mild pain.   Yes [provider]  nystatin (MYCOSTATIN) 100000 UNIT/ML suspension Use as directed 5 mLs in the mouth or throat See admin instructions. Swish/swallow 5 ml's by mouth three times a day- as directed   Yes [provider]  ondansetron (ZOFRAN ODT) 4 MG disintegrating tablet Take 1 tablet (4 mg total) by mouth every 8 (eight) hours  as needed for nausea or vomiting. Patient taking differently: Take 4 mg by mouth every 8 (eight) hours as needed for nausea or vomiting (DISSOLVE ORALLY).  07/21/20  Yes Yu, Amy V, PA-C  promethazine (PHENERGAN) 50 MG tablet Take 50 mg by mouth 3 (three) times daily.   Yes [provider]  predniSONE (DELTASONE) 20 MG tablet Take 20 mg by mouth 2  (two) times daily with a meal. FOR 5 DAYS 07/26/20 07/31/20  [provider]  dicyclomine (BENTYL) 20 MG tablet Take 1 tablet (20 mg total) by mouth 2 (two) times daily. Patient not taking: Reported on 04/30/2018 05/19/16 07/13/19  Gerhard MunchLockwood, Robert, MD  metoCLOPramide (REGLAN) 10 MG tablet Take 1 tablet (10 mg total) by mouth 3 (three) times daily as needed for nausea (headache / nausea). Patient not taking: Reported on 04/30/2018 02/06/16 07/13/19  Eber HongMiller, Brian, MD    Physical Exam: Vitals:   07/27/20 1230 07/27/20 1245 07/27/20 1300 07/27/20 1315  BP: 140/89 133/85 125/80 124/79  Pulse: (!) 104 (!) 104 95 100  Resp:  (!) 25 (!) 22 20  Temp:      TempSrc:      SpO2: 92% 96% 98% 100%  Weight:      Height:        Constitutional: NAD, calm, comfortable, on 2 L of oxygen via nasal cannula, communicating well Eyes: PERRL, lids and conjunctivae normal ENMT: White patches noted on tongue.  Posterior pharynx clear of any exudate or lesions. Normal dentition.  Neck: normal, supple, no masses, no thyromegaly Respiratory: Crackles on the bases.  No wheezing. Cardiovascular: Regular rate and rhythm, no murmurs / rubs / gallops. No extremity edema. 2+ pedal pulses. No carotid bruits.  Abdomen: no tenderness, no masses palpated. No hepatosplenomegaly. Bowel sounds positive.  Musculoskeletal: no clubbing / cyanosis. No joint deformity upper and lower extremities. Good ROM, no contractures. Normal muscle tone.  Skin: no rashes, lesions, ulcers. No induration Neurologic: CN 2-12 grossly intact. Sensation intact, DTR normal. Strength 5/5 in all 4.  Psychiatric: Normal judgment and insight. Alert and oriented x 3. Normal mood.    Labs on Admission: I have personally reviewed following labs and imaging studies  CBC: Recent Labs  Lab 07/27/20 0925  WBC 8.6  NEUTROABS 6.5  HGB 15.7  HCT 44.2  MCV 86.7  PLT 264   Basic Metabolic Panel: Recent Labs  Lab 07/27/20 0925  NA 141  K 3.4*  CL  102  CO2 25  GLUCOSE 107*  BUN 15  CREATININE 1.14  CALCIUM 8.4*   GFR: Estimated Creatinine Clearance: 112.2 mL/min (by C-G formula based on SCr of 1.14 mg/dL). Liver Function Tests: Recent Labs  Lab 07/27/20 0925  AST 42*  ALT 36  ALKPHOS 28*  BILITOT 0.7  PROT 6.8  ALBUMIN 3.5   No results for input(s): LIPASE, AMYLASE in the last 168 hours. No results for input(s): AMMONIA in the last 168 hours. Coagulation Profile: No results for input(s): INR, PROTIME in the last 168 hours. Cardiac Enzymes: No results for input(s): CKTOTAL, CKMB, CKMBINDEX, TROPONINI in the last 168 hours. BNP (last 3 results) No results for input(s): PROBNP in the last 8760 hours. HbA1C: No results for input(s): HGBA1C in the last 72 hours. CBG: No results for input(s): GLUCAP in the last 168 hours. Lipid Profile: No results for input(s): CHOL, HDL, LDLCALC, TRIG, CHOLHDL, LDLDIRECT in the last 72 hours. Thyroid Function Tests: No results for input(s): TSH, T4TOTAL, FREET4, T3FREE, THYROIDAB in the  last 72 hours. Anemia Panel: No results for input(s): VITAMINB12, FOLATE, FERRITIN, TIBC, IRON, RETICCTPCT in the last 72 hours. Urine analysis:    Component Value Date/Time   COLORURINE YELLOW 07/27/2020 1245   APPEARANCEUR HAZY (A) 07/27/2020 1245   LABSPEC 1.032 (H) 07/27/2020 1245   PHURINE 6.0 07/27/2020 1245   GLUCOSEU NEGATIVE 07/27/2020 1245   HGBUR NEGATIVE 07/27/2020 1245   BILIRUBINUR NEGATIVE 07/27/2020 1245   KETONESUR 5 (A) 07/27/2020 1245   PROTEINUR 30 (A) 07/27/2020 1245   NITRITE NEGATIVE 07/27/2020 1245   LEUKOCYTESUR NEGATIVE 07/27/2020 1245    Radiological Exams on Admission: CT Angio Chest PE W and/or Wo Contrast  Result Date: 07/27/2020 CLINICAL DATA:  High probability of pulmonary embolus. Shortness of breath. Patient tested positive for COVID-19 July 21, 2020. EXAM: CT ANGIOGRAPHY CHEST WITH CONTRAST TECHNIQUE: Multidetector CT imaging of the chest was performed  using the standard protocol during bolus administration of intravenous contrast. Multiplanar CT image reconstructions and MIPs were obtained to evaluate the vascular anatomy. CONTRAST:  33mL OMNIPAQUE IOHEXOL 350 MG/ML SOLN COMPARISON:  Chest x-ray July 27, 2020 FINDINGS: Cardiovascular: Satisfactory opacification of the pulmonary arteries to the segmental level. No evidence of pulmonary embolism. Normal heart size. No pericardial effusion. Mediastinum/Nodes: No enlarged mediastinal, hilar, or axillary lymph nodes. Thyroid gland, trachea, and esophagus demonstrate no significant findings. Lungs/Pleura: Central airways are normal. No pneumothorax. No mediastinal air. Patchy bilateral pulmonary infiltrates, particularly in the peripheries of the lungs and most prominent the bases is consistent with multifocal pneumonia. Upper Abdomen: No acute abnormality. Musculoskeletal: No chest wall abnormality. No acute or significant osseous findings. Review of the MIP images confirms the above findings. IMPRESSION: 1. Multifocal COVID-19 pneumonia. 2. No pulmonary emboli. Electronically Signed   By: Gerome Sam III M.D   On: 07/27/2020 14:05   DG Chest Portable 1 View  Result Date: 07/27/2020 CLINICAL DATA:  COVID-19 infection. EXAM: PORTABLE CHEST 1 VIEW COMPARISON:  Chest radiograph-02/05/2016 FINDINGS: Apparent enlargement of the cardiac silhouette is favored to be secondary to AP projection and decreased lung volumes. Interval development of peripheral and basilar predominant interstitial and airspace opacities. No pleural effusion or pneumothorax. No evidence of edema. No acute osseous abnormalities. IMPRESSION: Bilateral mid and lower lung interstitial and airspace opacities, nonspecific though compatible with provided history of COVID-19 infection Electronically Signed   By: Simonne Come M.D.   On: 07/27/2020 10:00    Assessment/Plan Principal Problem:   Acute hypoxemic respiratory failure due to  COVID-19 [facility]) Active Problems:   Sepsis (HCC)   Hypokalemia    Sepsis/acute hypoxemic respiratory failure secondary to COVID-19 pneumonia: -Tested positive for COVID-19 on 9/13.  Presented with fever of 101.5, tachycardia, tachypnea, hypoxia requiring 2 L of oxygen via nasal cannula.   -Reviewed chest x-ray and CT chest-negative for PE. -Admit patient stepdown unit for close monitoring.  On continuous pulse ox.  We will try to wean off of oxygen as tolerated. -Start on remdesivir as per pharmacy and Solu-Medrol.  Check inflammatory markers. -Start on p.o. vitamins, antitussive, albuterol every 6 hours. -Repeat inflammatory markers tomorrow AM. -Patient was told that if COVID-19 pneumonitis gets worse we might potentially use Actemra off label, he denies known history of hepatitis or tuberculosis and wants to proceed with Actemra if required. -Monitor vitals closely. -Imodium as needed for loose stool.  Hypokalemia: Replenished in ED.  Check magnesium level.  Repeat BMP tomorrow a.m.  Asymptomatic bacteriuria: UA positive for rare bacteria however patient denies any urinary symptoms.  No indication of antibiotics at this time.  Thrush: cont. Nystatin.  Tobacco use: Patient tells me that he only smokes 1 to 2 cigarettes/week   DVT prophylaxis: Lovenox/SCD Code Status: Full code Family Communication: None present at bedside.  Plan of care discussed with patient in length and he verbalized understanding and agreed with it. Disposition Plan: Home in 3 to 4 days Consults called: None Admission status: Inpatient   Ollen Bowl MD [facility]  If 7PM-7AM, please contact night-coverage [email or web address] Password [facility]  07/27/2020, 2:41 PM

## 2020-07-28 DIAGNOSIS — R0902 Hypoxemia: Secondary | ICD-10-CM

## 2020-07-28 DIAGNOSIS — E876 Hypokalemia: Secondary | ICD-10-CM

## 2020-07-28 LAB — CBC WITH DIFFERENTIAL/PLATELET
Abs Immature Granulocytes: 0.02 10*3/uL (ref 0.00–0.07)
Basophils Absolute: 0 10*3/uL (ref 0.0–0.1)
Basophils Relative: 0 %
Eosinophils Absolute: 0 10*3/uL (ref 0.0–0.5)
Eosinophils Relative: 0 %
HCT: 42.6 % (ref 39.0–52.0)
Hemoglobin: 15.1 g/dL (ref 13.0–17.0)
Immature Granulocytes: 0 %
Lymphocytes Relative: 15 %
Lymphs Abs: 0.7 10*3/uL (ref 0.7–4.0)
MCH: 30.5 pg (ref 26.0–34.0)
MCHC: 35.4 g/dL (ref 30.0–36.0)
MCV: 86.1 fL (ref 80.0–100.0)
Monocytes Absolute: 0.1 10*3/uL (ref 0.1–1.0)
Monocytes Relative: 3 %
Neutro Abs: 3.9 10*3/uL (ref 1.7–7.7)
Neutrophils Relative %: 82 %
Platelets: 296 10*3/uL (ref 150–400)
RBC: 4.95 MIL/uL (ref 4.22–5.81)
RDW: 12.9 % (ref 11.5–15.5)
WBC: 4.8 10*3/uL (ref 4.0–10.5)
nRBC: 0 % (ref 0.0–0.2)

## 2020-07-28 LAB — COMPREHENSIVE METABOLIC PANEL
ALT: 50 U/L — ABNORMAL HIGH (ref 0–44)
AST: 52 U/L — ABNORMAL HIGH (ref 15–41)
Albumin: 3.4 g/dL — ABNORMAL LOW (ref 3.5–5.0)
Alkaline Phosphatase: 26 U/L — ABNORMAL LOW (ref 38–126)
Anion gap: 10 (ref 5–15)
BUN: 15 mg/dL (ref 6–20)
CO2: 25 mmol/L (ref 22–32)
Calcium: 8.8 mg/dL — ABNORMAL LOW (ref 8.9–10.3)
Chloride: 105 mmol/L (ref 98–111)
Creatinine, Ser: 0.96 mg/dL (ref 0.61–1.24)
GFR calc Af Amer: >60 mL/min (ref >60)
GFR calc non Af Amer: >60 mL/min (ref >60)
Glucose, Bld: 142 mg/dL — ABNORMAL HIGH (ref 70–99)
Potassium: 4.2 mmol/L (ref 3.5–5.1)
Sodium: 140 mmol/L (ref 135–145)
Total Bilirubin: 0.6 mg/dL (ref 0.3–1.2)
Total Protein: 7 g/dL (ref 6.5–8.1)

## 2020-07-28 LAB — D-DIMER, QUANTITATIVE: D-Dimer, Quant: 0.38 ug/mL-FEU (ref 0.00–0.50)

## 2020-07-28 LAB — MAGNESIUM: Magnesium: 2.2 mg/dL (ref 1.7–2.4)

## 2020-07-28 LAB — C-REACTIVE PROTEIN: CRP: 6 mg/dL — ABNORMAL HIGH (ref <1.0)

## 2020-07-28 LAB — PHOSPHORUS: Phosphorus: 4 mg/dL (ref 2.5–4.6)

## 2020-07-28 LAB — FERRITIN: Ferritin: 1184 ng/mL — ABNORMAL HIGH (ref 24–336)

## 2020-07-28 MED ORDER — ENSURE ENLIVE PO LIQD
237.0000 mL | Freq: Two times a day (BID) | ORAL | Status: DC
  Administered 2020-07-29 (×2): 237 mL via ORAL

## 2020-07-28 NOTE — Progress Notes (Signed)
PROGRESS NOTE                                                                                                                                                                                                             Patient Demographics:    Dylan Douglas, is a 29 y.o. male, DOB - [birth year], OIN:[id number]  Outpatient Primary MD for the patient is [facility] [place]   Admit date - 07/27/2020   LOS - 1  No chief complaint on file.      Brief Narrative: Patient is a 29 y.o. male with PMHx of IBS-who tested positive for COVID-19 on 9/13-but has been having symptoms for approximately 10 days prior to this hospitalization-presented to the hospital on 9/19 with worsening shortness of breath-found to have acute hypoxic respiratory failure secondary to COVID-19 pneumonia and admitted to the hospitalist service.  See below for further details.  COVID-19 vaccinated status: Unvaccinated  Significant Events: 9/19>> Admit to [facility] for acute hypoxic respiratory failure secondary to COVID-19 pneumonia.  Significant studies: 9/19>>Chest x-ray: Patchy bilateral lung opacities 9/19>> CTA chest: No PE, multifocal pneumonia  COVID-19 medications: Steroids: 9/19>> Remdesivir: 9/19>>  Antibiotics: None  Microbiology data: 9/19 >>blood culture: Pending  Procedures: None  Consults: None  DVT prophylaxis: enoxaparin (LOVENOX) injection 40 mg Start: 07/27/20 2000 SCDs Start: 07/27/20 1439    Subjective:    Dylan Douglas today appears stable at rest-no shortness of breath at rest-but does have moderate shortness of breath and coughing spells with minimal ambulation.   Assessment  & Plan :   Acute Hypoxic Resp Failure due to Covid 19 Viral pneumonia: Titrated down to 3 L of oxygen this morning-appears comfortable at rest-continue steroids/remdesivir.  If hypoxemia worsens-he has consented to the use of  baricitinib.  He has no history of TB, hepatitis B, understands risk of opportunistic infections and VTE.  He is aware that baricitinib is under EUA by FDA  Sepsis physiology not present on admission-sepsis ruled out.  Fever: afebrile O2 requirements:  SpO2: 93 % O2 Flow Rate (L/min): 4 L/min   COVID-19 Labs: Recent Labs    07/27/20 1655 07/28/20 0414  DDIMER 0.30 0.38  FERRITIN 1,281* 1,184*  LDH 308*  --   CRP 5.0* 6.0*       Component Value Date/Time   BNP 18.3 07/27/2020  1655    Recent Labs  Lab 07/27/20 1655  PROCALCITON 0.11    Lab Results  Component Value Date   SARSCOV2NAA Detected (A) 07/21/2020   SARSCOV2NAA Not Detected 07/17/2020   SARSCOV2NAA Not Detected 12/10/2019   SARSCOV2NAA Not Detected 10/02/2019     Prone/Incentive Spirometry: encouraged  incentive spirometry use 3-4/hour.  Transaminitis: Secondary to COVID-19-follow.  IBS: Appears stable  Oral thrush: Continue nystatin  Nutrition Problem:   ABG: No results found for: PHART, PCO2ART, PO2ART, HCO3, TCO2, ACIDBASEDEF, O2SAT  Vent Settings: N/A    Condition -Stable  Family Communication  : Patient to update family himself.  I have asked him to let me know if his family's questions for me.  Code Status :  Full Code  Diet :  Diet Order            Diet regular Room service appropriate? Yes; Fluid consistency: Thin  Diet effective now                  Disposition Plan  :   Status is: Inpatient  Remains inpatient appropriate because:Inpatient level of care appropriate due to severity of illness   Dispo: The patient is from: Home              Anticipated d/c is to: Home              Anticipated d/c date is: 2 days              Patient currently is not medically stable to d/c.   Barriers to discharge: Hypoxia requiring O2 supplementation/complete 5 days of IV Remdesivir  Antimicorbials  :    Anti-infectives (From admission, onward)   Start     Dose/Rate Route  Frequency Ordered Stop   07/28/20 1000  remdesivir 100 mg in sodium chloride 0.9 % 100 mL IVPB       "Followed by" Linked Group Details   100 mg 200 mL/hr over 30 Minutes Intravenous Daily 07/27/20 1441 08/01/20 0959   07/27/20 1600  remdesivir 200 mg in sodium chloride 0.9% 250 mL IVPB       "Followed by" Linked Group Details   200 mg 580 mL/hr over 30 Minutes Intravenous Once 07/27/20 1441 07/27/20 1726      Inpatient Medications  Scheduled Meds: . albuterol  2 puff Inhalation Q6H  . vitamin C  500 mg Oral Daily  . enoxaparin (LOVENOX) injection  40 mg Subcutaneous Q24H  . methylPREDNISolone (SOLU-MEDROL) injection  1 mg/kg Intravenous BID   Followed by  . [START ON 07/30/2020] predniSONE  50 mg Oral Daily  . nystatin  5 mL Oral TID  . zinc sulfate  220 mg Oral Daily   Continuous Infusions: . remdesivir 100 mg in NS 100 mL 100 mg (07/28/20 0944)   PRN Meds:.acetaminophen, chlorpheniramine-HYDROcodone, guaiFENesin-dextromethorphan, loperamide, ondansetron **OR** ondansetron (ZOFRAN) IV   Time Spent in minutes  25  See all Orders from today for further details   Jeoffrey Massed M.D on 07/28/2020 at 1:17 PM  To page go to [email or web address] - use universal password  [facility] -  Office  [phone]    Objective:   Vitals:   07/28/20 0035 07/28/20 0500 07/28/20 0754 07/28/20 1138  BP: 140/79 132/68 131/73 124/80  Pulse: 84 78 62 82  Resp: (!) 23 17 16 18   Temp: 98.2 F (36.8 C) 97.9 F (36.6 C) 97.9 F (36.6 C) 98 F (36.7 C)  TempSrc: Oral Oral Oral Oral  SpO2:  94% 93% 93% 93%  Weight:      Height:        Wt Readings from Last 3 Encounters:  07/27/20 94.6 kg  03/14/20 99.8 kg  04/30/18 99.8 kg     Intake/Output Summary (Last 24 hours) at 07/28/2020 1317 Last data filed at 07/28/2020 0958 Gross per 24 hour  Intake 1311.89 ml  Output 425 ml  Net 886.89 ml     Physical Exam Gen Exam:Alert awake-not in any distress HEENT:atraumatic,  normocephalic Chest: B/L clear to auscultation anteriorly CVS:S1S2 regular Abdomen:soft non tender, non distended Extremities:no edema Neurology: Non focal Skin: no rash   Data Review:    CBC Recent Labs  Lab 07/27/20 0925 07/27/20 1655 07/28/20 0414  WBC 8.6 7.9 4.8  HGB 15.7 15.7 15.1  HCT 44.2 45.9 42.6  PLT 264 254 296  MCV 86.7 86.8 86.1  MCH 30.8 29.7 30.5  MCHC 35.5 34.2 35.4  RDW 12.7 13.0 12.9  LYMPHSABS 1.3  --  0.7  MONOABS 0.7  --  0.1  EOSABS 0.0  --  0.0  BASOSABS 0.0  --  0.0    Chemistries  Recent Labs  Lab 07/27/20 0925 07/27/20 1655 07/28/20 0414  NA 141  --  140  K 3.4*  --  4.2  CL 102  --  105  CO2 25  --  25  GLUCOSE 107*  --  142*  BUN 15  --  15  CREATININE 1.14 1.13 0.96  CALCIUM 8.4*  --  8.8*  MG  --  2.1 2.2  AST 42*  --  52*  ALT 36  --  50*  ALKPHOS 28*  --  26*  BILITOT 0.7  --  0.6   ------------------------------------------------------------------------------------------------------------------ No results for input(s): CHOL, HDL, LDLCALC, TRIG, CHOLHDL, LDLDIRECT in the last 72 hours.  No results found for: HGBA1C ------------------------------------------------------------------------------------------------------------------ No results for input(s): TSH, T4TOTAL, T3FREE, THYROIDAB in the last 72 hours.  Invalid input(s): FREET3 ------------------------------------------------------------------------------------------------------------------ Recent Labs    07/27/20 1655 07/28/20 0414  FERRITIN 1,281* 1,184*    Coagulation profile No results for input(s): INR, PROTIME in the last 168 hours.  Recent Labs    07/27/20 1655 07/28/20 0414  DDIMER 0.30 0.38    Cardiac Enzymes No results for input(s): CKMB, TROPONINI, MYOGLOBIN in the last 168 hours.  Invalid input(s): CK ------------------------------------------------------------------------------------------------------------------    Component Value  Date/Time   BNP 18.3 07/27/2020 1655    Micro Results Recent Results (from the past 240 hour(s))  Novel Coronavirus, NAA ([facility])     Status: Abnormal   Collection Time: 07/21/20  1:26 PM   Specimen: Nasopharyngeal Swab; Nasopharyngeal(NP) swabs in vial transport medium   Nasopharynge  Result Value Ref Range Status   SARS-CoV-2, NAA Detected (A) Not Detected Final    Comment: Patients who have a positive COVID-19 test result may now have treatment options. Treatment options are available for patients with mild to moderate symptoms and for hospitalized patients. Visit our website at [email or web address] for resources and information. This nucleic acid amplification test was developed and its performance characteristics determined by [employer]. Nucleic acid amplification tests include RT-PCR and TMA. This test has not been FDA cleared or approved. This test has been authorized by FDA under an Emergency Use Authorization (EUA). This test is only authorized for the duration of time the declaration that circumstances exist justifying the authorization of the emergency use of in vitro diagnostic tests for detection of SARS-CoV-2 virus and/or diagnosis of COVID-19  infection under section 564(b)(1) of the Act, 21 U.S.C. 161WRU-0(A360bbb-3(b) (1), unless the authorization is terminated or revoked sooner. When diagnostic testing is negativ e, the possibility of a false negative result should be considered in the context of a patient's recent exposures and the presence of clinical signs and symptoms consistent with COVID-19. An individual without symptoms of COVID-19 and who is not shedding SARS-CoV-2 virus would expect to have a negative (not detected) result in this assay.   SARS-COV-2, NAA 2 DAY TAT     Status: None   Collection Time: 07/21/20  1:26 PM   Nasopharynge  Result Value Ref Range Status   SARS-CoV-2, NAA 2 DAY TAT Performed  Final    Radiology Reports CT  Angio Chest PE W and/or Wo Contrast  Result Date: 07/27/2020 CLINICAL DATA:  High probability of pulmonary embolus. Shortness of breath. Patient tested positive for COVID-19 July 21, 2020. EXAM: CT ANGIOGRAPHY CHEST WITH CONTRAST TECHNIQUE: Multidetector CT imaging of the chest was performed using the standard protocol during bolus administration of intravenous contrast. Multiplanar CT image reconstructions and MIPs were obtained to evaluate the vascular anatomy. CONTRAST:  75mL OMNIPAQUE IOHEXOL 350 MG/ML SOLN COMPARISON:  Chest x-ray July 27, 2020 FINDINGS: Cardiovascular: Satisfactory opacification of the pulmonary arteries to the segmental level. No evidence of pulmonary embolism. Normal heart size. No pericardial effusion. Mediastinum/Nodes: No enlarged mediastinal, hilar, or axillary lymph nodes. Thyroid gland, trachea, and esophagus demonstrate no significant findings. Lungs/Pleura: Central airways are normal. No pneumothorax. No mediastinal air. Patchy bilateral pulmonary infiltrates, particularly in the peripheries of the lungs and most prominent the bases is consistent with multifocal pneumonia. Upper Abdomen: No acute abnormality. Musculoskeletal: No chest wall abnormality. No acute or significant osseous findings. Review of the MIP images confirms the above findings. IMPRESSION: 1. Multifocal COVID-19 pneumonia. 2. No pulmonary emboli. Electronically Signed   By: Gerome Samavid  Williams III M.D   On: 07/27/2020 14:05   DG Chest Portable 1 View  Result Date: 07/27/2020 CLINICAL DATA:  COVID-19 infection. EXAM: PORTABLE CHEST 1 VIEW COMPARISON:  Chest radiograph-02/05/2016 FINDINGS: Apparent enlargement of the cardiac silhouette is favored to be secondary to AP projection and decreased lung volumes. Interval development of peripheral and basilar predominant interstitial and airspace opacities. No pleural effusion or pneumothorax. No evidence of edema. No acute osseous abnormalities. IMPRESSION:  Bilateral mid and lower lung interstitial and airspace opacities, nonspecific though compatible with provided history of COVID-19 infection Electronically Signed   By: Simonne ComeJohn  Watts M.D.   On: 07/27/2020 10:00

## 2020-07-28 NOTE — Progress Notes (Signed)
Initial Nutrition Assessment  DOCUMENTATION CODES:   Not applicable  INTERVENTION:   - Took pt's meal order and changed pt to "with assist" for meal ordering  - Ensure Enlive po BID, each supplement provides 350 kcal and 20 grams of protein  - Encourage adequate PO intake  NUTRITION DIAGNOSIS:   Increased nutrient needs related to acute illness (COVID-19 pneumonia) as evidenced by estimated needs.  GOAL:   Patient will meet greater than or equal to 90% of their needs  MONITOR:   PO intake, Supplement acceptance, Labs, Weight trends  REASON FOR ASSESSMENT:   Malnutrition Screening Tool    ASSESSMENT:   29 year old male who presented to the ED on 9/19 with ongoing SOB and fatigue related to positive COVID-19 test on 9/13. PMH of IBS, tobacco use. Pt admitted with COVID-19 pneumonia.   Spoke with pt via phone call to room. Pt reports that he is feeling a little bit better today.  Pt reports decreased appetite and states that the food is bland and does not really have a taste. Pt reports that he was able to eat a few bites of potatoes, a few bites of [place], and a few bites of bread for lunch today. RD assisted pt in taking dinner meal order and changed pt to "with assist" so that he can receive assistance in ordering meals.  Pt reports that he is not longer experiencing nausea but that the nausea was affecting him significantly at home PTA. He reported little to no PO intake for 6 days PTA. He reports that he tried to eat crackers and chips but couldn't eat much due to nausea.  Pt endorses a 20 lb weight gain x 1 week. He reports his UBW as 223 lbs and states that he weighed 203 lbs the day PTA.  Reviewed weight history in chart. Pt with a 5.2 kg weight loss since 03/14/20. This is a 5.2% weight loss in less than 5 months which is not significant for timeframe. However, pt report this weight loss happened more acutely (over ~1 week).  Pt amenable to trying an oral nutrition  supplement to aid in meeting increased kcal and protein needs. Discussed with pt the need to maintain adequate PO intake related to increased nutrient needs.  Meal Completion: 75-100%  Medications reviewed and include: vitamin C, solu-medrol, nystatin, zinc sulfate, remdesivir  Labs reviewed: elevated LFTs  NUTRITION - FOCUSED PHYSICAL EXAM:  Unable to complete at this time.  Diet Order:   Diet Order            Diet regular Room service appropriate? Yes; Fluid consistency: Thin  Diet effective now                 EDUCATION NEEDS:   Education needs have been addressed  Skin:  Skin Assessment: Reviewed RN Assessment  Last BM:  no documented BM  Height:   Ht Readings from Last 1 Encounters:  07/27/20 5\' 10"  (1.778 m)    Weight:   Wt Readings from Last 1 Encounters:  07/27/20 94.6 kg    Ideal Body Weight:  75.5 kg  BMI:  Body mass index is 29.92 kg/m.  Estimated Nutritional Needs:   Kcal:  2400-2600  Protein:  115-130 grams  Fluid:  >/= 2.2 L    07/29/20, MS, RD, LDN Inpatient Clinical Dietitian Please see AMiON for contact information.

## 2020-07-28 NOTE — TOC Initial Note (Signed)
Transition of Care [facility]) - Initial/Assessment Note    Patient Details  Name: Dylan Douglas MRN: [id number] Date of Birth: 07/10/1991  Transition of Care [facility]) CM/SW Contact:    Lockie Pares, RN Phone Number: 07/28/2020, 12:03 PM  Clinical Narrative:                 Patient positive COVID pneumonia on 2LPM currently . NO home care needs identified, however may need home oxygen post hospitalization. CM will follow for needs  Expected Discharge Plan: Home/Self Care Barriers to Discharge: Continued Medical Work up   Patient Goals and CMS Choice        Expected Discharge Plan and Services Expected Discharge Plan: Home/Self Care       Living arrangements for the past 2 months: Single Family Home                                      Prior Living Arrangements/Services Living arrangements for the past 2 months: Single Family Home   Patient language and need for interpreter reviewed:: Yes        Need for Family Participation in Patient Care: Yes (Comment) Care giver support system in place?: Yes (comment)   Criminal Activity/Legal Involvement Pertinent to Current Situation/Hospitalization: No - Comment as needed  Activities of Daily Living Home Assistive Devices/Equipment: None ADL Screening (condition at time of admission) Patient's cognitive ability adequate to safely complete daily activities?: Yes Is the patient deaf or have difficulty hearing?: No Does the patient have difficulty seeing, even when wearing glasses/contacts?: No Does the patient have difficulty concentrating, remembering, or making decisions?: No Patient able to express need for assistance with ADLs?: Yes Does the patient have difficulty dressing or bathing?: No Independently performs ADLs?: Yes (appropriate for developmental age) Does the patient have difficulty walking or climbing stairs?: No Weakness of Legs: Both Weakness of Arms/Hands: Both  Permission Sought/Granted                   Emotional Assessment       Orientation: : Oriented to Self, Oriented to Place, Oriented to  Time, Oriented to Situation Alcohol / Substance Use: Not Applicable Psych Involvement: No (comment)  Admission diagnosis:  Hypoxia [R09.02] Acute hypoxemic respiratory failure due to COVID-19 (HCC) [U07.1, J96.01] COVID-19 [U07.1] Patient Active Problem List   Diagnosis Date Noted  . Acute hypoxemic respiratory failure due to COVID-19 (HCC) 07/27/2020  . Sepsis (HCC) 07/27/2020  . Hypokalemia 07/27/2020  . IBS (irritable bowel syndrome) 05/27/2015  . Palpitations 07/31/2013   PCP:  [facility] [place] [facility]:   [facility] [address] [place] - [address] [place] [address] Phone: [phone] Fax: [phone]     Social Determinants of Health (SDOH) Interventions    Readmission Risk Interventions No flowsheet data found.

## 2020-07-29 LAB — C-REACTIVE PROTEIN: CRP: 1.9 mg/dL — ABNORMAL HIGH (ref <1.0)

## 2020-07-29 LAB — COMPREHENSIVE METABOLIC PANEL
ALT: 60 U/L — ABNORMAL HIGH (ref 0–44)
AST: 45 U/L — ABNORMAL HIGH (ref 15–41)
Albumin: 3.4 g/dL — ABNORMAL LOW (ref 3.5–5.0)
Alkaline Phosphatase: 30 U/L — ABNORMAL LOW (ref 38–126)
Anion gap: 10 (ref 5–15)
BUN: 18 mg/dL (ref 6–20)
CO2: 25 mmol/L (ref 22–32)
Calcium: 9.1 mg/dL (ref 8.9–10.3)
Chloride: 106 mmol/L (ref 98–111)
Creatinine, Ser: 0.93 mg/dL (ref 0.61–1.24)
GFR calc Af Amer: >60 mL/min (ref >60)
GFR calc non Af Amer: >60 mL/min (ref >60)
Glucose, Bld: 159 mg/dL — ABNORMAL HIGH (ref 70–99)
Potassium: 4 mmol/L (ref 3.5–5.1)
Sodium: 141 mmol/L (ref 135–145)
Total Bilirubin: 0.6 mg/dL (ref 0.3–1.2)
Total Protein: 6.9 g/dL (ref 6.5–8.1)

## 2020-07-29 LAB — CBC WITH DIFFERENTIAL/PLATELET
Abs Immature Granulocytes: 0.05 10*3/uL (ref 0.00–0.07)
Basophils Absolute: 0 10*3/uL (ref 0.0–0.1)
Basophils Relative: 0 %
Eosinophils Absolute: 0 10*3/uL (ref 0.0–0.5)
Eosinophils Relative: 0 %
HCT: 43.6 % (ref 39.0–52.0)
Hemoglobin: 15.1 g/dL (ref 13.0–17.0)
Immature Granulocytes: 1 %
Lymphocytes Relative: 11 %
Lymphs Abs: 1.2 10*3/uL (ref 0.7–4.0)
MCH: 29.4 pg (ref 26.0–34.0)
MCHC: 34.6 g/dL (ref 30.0–36.0)
MCV: 84.8 fL (ref 80.0–100.0)
Monocytes Absolute: 0.5 10*3/uL (ref 0.1–1.0)
Monocytes Relative: 5 %
Neutro Abs: 9 10*3/uL — ABNORMAL HIGH (ref 1.7–7.7)
Neutrophils Relative %: 83 %
Platelets: 347 10*3/uL (ref 150–400)
RBC: 5.14 MIL/uL (ref 4.22–5.81)
RDW: 12.7 % (ref 11.5–15.5)
WBC: 10.8 10*3/uL — ABNORMAL HIGH (ref 4.0–10.5)
nRBC: 0 % (ref 0.0–0.2)

## 2020-07-29 LAB — MAGNESIUM: Magnesium: 2.5 mg/dL — ABNORMAL HIGH (ref 1.7–2.4)

## 2020-07-29 LAB — FERRITIN: Ferritin: 1253 ng/mL — ABNORMAL HIGH (ref 24–336)

## 2020-07-29 LAB — D-DIMER, QUANTITATIVE: D-Dimer, Quant: 0.53 ug/mL-FEU — ABNORMAL HIGH (ref 0.00–0.50)

## 2020-07-29 LAB — GLUCOSE, CAPILLARY: Glucose-Capillary: 172 mg/dL — ABNORMAL HIGH (ref 70–99)

## 2020-07-29 MED ORDER — HYDROCOD POLST-CPM POLST ER 10-8 MG/5ML PO SUER
5.0000 mL | Freq: Two times a day (BID) | ORAL | Status: DC | PRN
  Administered 2020-07-30: 5 mL via ORAL
  Filled 2020-07-29: qty 5

## 2020-07-29 NOTE — Progress Notes (Signed)
PROGRESS NOTE                                                                                                                                                                                                             Patient Demographics:    Dylan Douglas, is a 29 y.o. male, DOB - [birth year], HWE:[id number]  Outpatient Primary MD for the patient is [facility] [place]   Admit date - 07/27/2020   LOS - 2  No chief complaint on file.      Brief Narrative: Patient is a 29 y.o. male with PMHx of IBS-who tested positive for COVID-19 on 9/13-but has been having symptoms for approximately 10 days prior to this hospitalization-presented to the hospital on 9/19 with worsening shortness of breath-found to have acute hypoxic respiratory failure secondary to COVID-19 pneumonia and admitted to the hospitalist service.  See below for further details.  COVID-19 vaccinated status: Unvaccinated  Significant Events: 9/19>> Admit to [facility] for acute hypoxic respiratory failure secondary to COVID-19 pneumonia.  Significant studies: 9/19>>Chest x-ray: Patchy bilateral lung opacities 9/19>> CTA chest: No PE, multifocal pneumonia  COVID-19 medications: Steroids: 9/19>> Remdesivir: 9/19>>  Antibiotics: None  Microbiology data: 9/19 >>blood culture: No growth  Procedures: None  Consults: None  DVT prophylaxis: enoxaparin (LOVENOX) injection 40 mg Start: 07/27/20 2000 SCDs Start: 07/27/20 1439    Subjective:   Lying comfortably in bed-still feels weak and tired-was on 3 L of oxygen-titrated down to 1.5 L.   Assessment  & Plan :   Acute Hypoxic Resp Failure due to Covid 19 Viral pneumonia: Slowly improving-on 3 L of oxygen this morning-has been titrated down to 1.5 L-plan is to continue with steroids/remdesivir.   If hypoxemia worsens-he has consented to the use of baricitinib.  He has no history of TB,  hepatitis B, understands risk of opportunistic infections and VTE.  He is aware that baricitinib is under EUA by FDA  Sepsis physiology not present on admission-sepsis ruled out.  Fever: afebrile O2 requirements:  SpO2: 91 % O2 Flow Rate (L/min): 3 L/min   COVID-19 Labs: Recent Labs    07/27/20 1655 07/28/20 0414 07/29/20 0314  DDIMER 0.30 0.38 0.53*  FERRITIN 1,281* 1,184* 1,253*  LDH 308*  --   --   CRP 5.0* 6.0* 1.9*  Component Value Date/Time   BNP 18.3 07/27/2020 1655    Recent Labs  Lab 07/27/20 1655  PROCALCITON 0.11    Lab Results  Component Value Date   SARSCOV2NAA Detected (A) 07/21/2020   SARSCOV2NAA Not Detected 07/17/2020   SARSCOV2NAA Not Detected 12/10/2019   SARSCOV2NAA Not Detected 10/02/2019     Prone/Incentive Spirometry: encouraged  incentive spirometry use 3-4/hour.  Transaminitis: Secondary to COVID-19-follow.  IBS: Appears stable  Oral thrush: Continue nystatin   ABG: No results found for: PHART, PCO2ART, PO2ART, HCO3, TCO2, ACIDBASEDEF, O2SAT  Vent Settings: N/A    Condition -Stable  Family Communication  : Patient to update family himself.  I have asked him to let me know if his family's questions for me.  Code Status :  Full Code  Diet :  Diet Order            Diet regular Room service appropriate? Yes; Fluid consistency: Thin  Diet effective now                  Disposition Plan  :   Status is: Inpatient  Remains inpatient appropriate because:Inpatient level of care appropriate due to severity of illness   Dispo: The patient is from: Home              Anticipated d/c is to: Home              Anticipated d/c date is: 2 days              Patient currently is not medically stable to d/c.   Barriers to discharge: Hypoxia requiring O2 supplementation/complete 5 days of IV Remdesivir  Antimicorbials  :    Anti-infectives (From admission, onward)   Start     Dose/Rate Route Frequency Ordered Stop    07/28/20 1000  remdesivir 100 mg in sodium chloride 0.9 % 100 mL IVPB       "Followed by" Linked Group Details   100 mg 200 mL/hr over 30 Minutes Intravenous Daily 07/27/20 1441 08/01/20 0959   07/27/20 1600  remdesivir 200 mg in sodium chloride 0.9% 250 mL IVPB       "Followed by" Linked Group Details   200 mg 580 mL/hr over 30 Minutes Intravenous Once 07/27/20 1441 07/27/20 1726      Inpatient Medications  Scheduled Meds: . albuterol  2 puff Inhalation Q6H  . vitamin C  500 mg Oral Daily  . enoxaparin (LOVENOX) injection  40 mg Subcutaneous Q24H  . feeding supplement (ENSURE ENLIVE)  237 mL Oral BID BM  . methylPREDNISolone (SOLU-MEDROL) injection  1 mg/kg Intravenous BID   Followed by  . [START ON 07/30/2020] predniSONE  50 mg Oral Daily  . nystatin  5 mL Oral TID  . zinc sulfate  220 mg Oral Daily   Continuous Infusions: . remdesivir 100 mg in NS 100 mL 100 mg (07/29/20 0913)   PRN Meds:.acetaminophen, chlorpheniramine-HYDROcodone, guaiFENesin-dextromethorphan, loperamide, ondansetron **OR** ondansetron (ZOFRAN) IV   Time Spent in minutes  25  See all Orders from today for further details   Jeoffrey Massed M.D on 07/29/2020 at 11:28 AM  To page go to [email or web address] - use universal password  [facility] -  Office  [phone]    Objective:   Vitals:   07/28/20 1926 07/28/20 2358 07/29/20 0358 07/29/20 0819  BP: 140/79 127/76 131/73 134/76  Pulse: 89 72 64 68  Resp: Temp: 98.2 F (36.8 C) 98.1 F (36.7 C)  98 F (36.7 C) (!) 97.4 F (36.3 C)  TempSrc: Oral Oral Oral Oral  SpO2: 92% 92% 92% 91%  Weight:      Height:        Wt Readings from Last 3 Encounters:  07/27/20 94.6 kg  03/14/20 99.8 kg  04/30/18 99.8 kg     Intake/Output Summary (Last 24 hours) at 07/29/2020 1128 Last data filed at 07/29/2020 0900 Gross per 24 hour  Intake 340 ml  Output 680 ml  Net -340 ml     Physical Exam Gen Exam:Alert awake-not in any  distress HEENT:atraumatic, normocephalic Chest: B/L clear to auscultation anteriorly CVS:S1S2 regular Abdomen:soft non tender, non distended Extremities:no edema Neurology: Non focal Skin: no rash   Data Review:    CBC Recent Labs  Lab 07/27/20 0925 07/27/20 1655 07/28/20 0414 07/29/20 0314  WBC 8.6 7.9 4.8 10.8*  HGB 15.7 15.7 15.1 15.1  HCT 44.2 45.9 42.6 43.6  PLT 264 254 296 347  MCV 86.7 86.8 86.1 84.8  MCH 30.8 29.7 30.5 29.4  MCHC 35.5 34.2 35.4 34.6  RDW 12.7 13.0 12.9 12.7  LYMPHSABS 1.3  --  0.7 1.2  MONOABS 0.7  --  0.1 0.5  EOSABS 0.0  --  0.0 0.0  BASOSABS 0.0  --  0.0 0.0    Chemistries  Recent Labs  Lab 07/27/20 0925 07/27/20 1655 07/28/20 0414 07/29/20 0314  NA 141  --  140 141  K 3.4*  --  4.2 4.0  CL 102  --  105 106  CO2 25  --  25 25  GLUCOSE 107*  --  142* 159*  BUN 15  --  15 18  CREATININE 1.14 1.13 0.96 0.93  CALCIUM 8.4*  --  8.8* 9.1  MG  --  2.1 2.2 2.5*  AST 42*  --  52* 45*  ALT 36  --  50* 60*  ALKPHOS 28*  --  26* 30*  BILITOT 0.7  --  0.6 0.6   ------------------------------------------------------------------------------------------------------------------ No results for input(s): CHOL, HDL, LDLCALC, TRIG, CHOLHDL, LDLDIRECT in the last 72 hours.  No results found for: HGBA1C ------------------------------------------------------------------------------------------------------------------ No results for input(s): TSH, T4TOTAL, T3FREE, THYROIDAB in the last 72 hours.  Invalid input(s): FREET3 ------------------------------------------------------------------------------------------------------------------ Recent Labs    07/28/20 0414 07/29/20 0314  FERRITIN 1,184* 1,253*    Coagulation profile No results for input(s): INR, PROTIME in the last 168 hours.  Recent Labs    07/28/20 0414 07/29/20 0314  DDIMER 0.38 0.53*    Cardiac Enzymes No results for input(s): CKMB, TROPONINI, MYOGLOBIN in the last 168  hours.  Invalid input(s): CK ------------------------------------------------------------------------------------------------------------------    Component Value Date/Time   BNP 18.3 07/27/2020 1655    Micro Results Recent Results (from the past 240 hour(s))  Novel Coronavirus, NAA ([facility])     Status: Abnormal   Collection Time: 07/21/20  1:26 PM   Specimen: Nasopharyngeal Swab; Nasopharyngeal(NP) swabs in vial transport medium   Nasopharynge  Result Value Ref Range Status   SARS-CoV-2, NAA Detected (A) Not Detected Final    Comment: Patients who have a positive COVID-19 test result may now have treatment options. Treatment options are available for patients with mild to moderate symptoms and for hospitalized patients. Visit our website at [email or web address] for resources and information. This nucleic acid amplification test was developed and its performance characteristics determined by [employer]. Nucleic acid amplification tests include RT-PCR and TMA. This test has not been FDA cleared or approved. This test has  been authorized by FDA under an Emergency Use Authorization (EUA). This test is only authorized for the duration of time the declaration that circumstances exist justifying the authorization of the emergency use of in vitro diagnostic tests for detection of SARS-CoV-2 virus and/or diagnosis of COVID-19 infection under section 564(b)(1) of the Act, 21 U.S.C. 675FFM-3(W) (1), unless the authorization is terminated or revoked sooner. When diagnostic testing is negativ e, the possibility of a false negative result should be considered in the context of a patient's recent exposures and the presence of clinical signs and symptoms consistent with COVID-19. An individual without symptoms of COVID-19 and who is not shedding SARS-CoV-2 virus would expect to have a negative (not detected) result in this assay.   SARS-COV-2, NAA 2 DAY TAT     Status:  None   Collection Time: 07/21/20  1:26 PM   Nasopharynge  Result Value Ref Range Status   SARS-CoV-2, NAA 2 DAY TAT Performed  Final  Culture, blood (Routine X 2) w Reflex to ID Panel     Status: None (Preliminary result)   Collection Time: 07/27/20  2:46 PM   Specimen: BLOOD  Result Value Ref Range Status   Specimen Description BLOOD RIGHT ANTECUBITAL  Final   Special Requests   Final    BOTTLES DRAWN AEROBIC AND ANAEROBIC Blood Culture results may not be optimal due to an inadequate volume of blood received in culture bottles   Culture   Final    NO GROWTH < 24 HOURS Performed at [facility], [address] [place], [place] [address]    Report Status PENDING  Incomplete  Culture, blood (Routine X 2) w Reflex to ID Panel     Status: None (Preliminary result)   Collection Time: 07/27/20  5:09 PM   Specimen: BLOOD LEFT HAND  Result Value Ref Range Status   Specimen Description BLOOD LEFT HAND  Final   Special Requests   Final    BOTTLES DRAWN AEROBIC AND ANAEROBIC Blood Culture adequate volume   Culture   Final    NO GROWTH < 24 HOURS Performed at [facility], [address] [place], [place] [address]    Report Status PENDING  Incomplete    Radiology Reports CT Angio Chest PE W and/or Wo Contrast  Result Date: 07/27/2020 CLINICAL DATA:  High probability of pulmonary embolus. Shortness of breath. Patient tested positive for COVID-19 July 21, 2020. EXAM: CT ANGIOGRAPHY CHEST WITH CONTRAST TECHNIQUE: Multidetector CT imaging of the chest was performed using the standard protocol during bolus administration of intravenous contrast. Multiplanar CT image reconstructions and MIPs were obtained to evaluate the vascular anatomy. CONTRAST:  13mL OMNIPAQUE IOHEXOL 350 MG/ML SOLN COMPARISON:  Chest x-ray July 27, 2020 FINDINGS: Cardiovascular: Satisfactory opacification of the pulmonary arteries to the segmental level. No evidence of pulmonary embolism. Normal heart  size. No pericardial effusion. Mediastinum/Nodes: No enlarged mediastinal, hilar, or axillary lymph nodes. Thyroid gland, trachea, and esophagus demonstrate no significant findings. Lungs/Pleura: Central airways are normal. No pneumothorax. No mediastinal air. Patchy bilateral pulmonary infiltrates, particularly in the peripheries of the lungs and most prominent the bases is consistent with multifocal pneumonia. Upper Abdomen: No acute abnormality. Musculoskeletal: No chest wall abnormality. No acute or significant osseous findings. Review of the MIP images confirms the above findings. IMPRESSION: 1. Multifocal COVID-19 pneumonia. 2. No pulmonary emboli. Electronically Signed   By: Gerome Sam III M.D   On: 07/27/2020 14:05   DG Chest Portable 1 View  Result Date: 07/27/2020 CLINICAL DATA:  COVID-19 infection. EXAM: PORTABLE CHEST 1 VIEW COMPARISON:  Chest radiograph-02/05/2016 FINDINGS: Apparent enlargement of the cardiac silhouette is favored to be secondary to AP projection and decreased lung volumes. Interval development of peripheral and basilar predominant interstitial and airspace opacities. No pleural effusion or pneumothorax. No evidence of edema. No acute osseous abnormalities. IMPRESSION: Bilateral mid and lower lung interstitial and airspace opacities, nonspecific though compatible with provided history of COVID-19 infection Electronically Signed   By: Simonne ComeJohn  Watts M.D.   On: 07/27/2020 10:00

## 2020-07-30 LAB — CBC WITH DIFFERENTIAL/PLATELET
Abs Immature Granulocytes: 0.15 10*3/uL — ABNORMAL HIGH (ref 0.00–0.07)
Basophils Absolute: 0 10*3/uL (ref 0.0–0.1)
Basophils Relative: 0 %
Eosinophils Absolute: 0 10*3/uL (ref 0.0–0.5)
Eosinophils Relative: 0 %
HCT: 44.6 % (ref 39.0–52.0)
Hemoglobin: 15.4 g/dL (ref 13.0–17.0)
Immature Granulocytes: 1 %
Lymphocytes Relative: 8 %
Lymphs Abs: 1.1 10*3/uL (ref 0.7–4.0)
MCH: 29.7 pg (ref 26.0–34.0)
MCHC: 34.5 g/dL (ref 30.0–36.0)
MCV: 85.9 fL (ref 80.0–100.0)
Monocytes Absolute: 0.6 10*3/uL (ref 0.1–1.0)
Monocytes Relative: 5 %
Neutro Abs: 11.9 10*3/uL — ABNORMAL HIGH (ref 1.7–7.7)
Neutrophils Relative %: 86 %
Platelets: 398 10*3/uL (ref 150–400)
RBC: 5.19 MIL/uL (ref 4.22–5.81)
RDW: 12.8 % (ref 11.5–15.5)
WBC: 13.8 10*3/uL — ABNORMAL HIGH (ref 4.0–10.5)
nRBC: 0 % (ref 0.0–0.2)

## 2020-07-30 LAB — COMPREHENSIVE METABOLIC PANEL
ALT: 49 U/L — ABNORMAL HIGH (ref 0–44)
AST: 24 U/L (ref 15–41)
Albumin: 3.4 g/dL — ABNORMAL LOW (ref 3.5–5.0)
Alkaline Phosphatase: 29 U/L — ABNORMAL LOW (ref 38–126)
Anion gap: 11 (ref 5–15)
BUN: 20 mg/dL (ref 6–20)
CO2: 26 mmol/L (ref 22–32)
Calcium: 8.9 mg/dL (ref 8.9–10.3)
Chloride: 102 mmol/L (ref 98–111)
Creatinine, Ser: 0.93 mg/dL (ref 0.61–1.24)
GFR calc Af Amer: >60 mL/min (ref >60)
GFR calc non Af Amer: >60 mL/min (ref >60)
Glucose, Bld: 168 mg/dL — ABNORMAL HIGH (ref 70–99)
Potassium: 4.1 mmol/L (ref 3.5–5.1)
Sodium: 139 mmol/L (ref 135–145)
Total Bilirubin: 0.4 mg/dL (ref 0.3–1.2)
Total Protein: 6.3 g/dL — ABNORMAL LOW (ref 6.5–8.1)

## 2020-07-30 LAB — HEMOGLOBIN A1C
Hgb A1c MFr Bld: 5.2 % (ref 4.8–5.6)
Mean Plasma Glucose: 102.54 mg/dL

## 2020-07-30 LAB — MAGNESIUM: Magnesium: 2.8 mg/dL — ABNORMAL HIGH (ref 1.7–2.4)

## 2020-07-30 LAB — D-DIMER, QUANTITATIVE: D-Dimer, Quant: 0.44 ug/mL-FEU (ref 0.00–0.50)

## 2020-07-30 LAB — C-REACTIVE PROTEIN: CRP: 0.7 mg/dL (ref <1.0)

## 2020-07-30 LAB — FERRITIN: Ferritin: 1202 ng/mL — ABNORMAL HIGH (ref 24–336)

## 2020-07-30 MED ORDER — PREDNISONE 20 MG PO TABS: ORAL_TABLET | ORAL | 0 refills | Status: AC

## 2020-07-30 NOTE — Discharge Summary (Signed)
Physician Discharge Summary  Dylan Douglas:[id number] DOB: November 24, 1990 DOA: 07/27/2020  PCP: [facility] [place]  Admit date: 07/27/2020 Discharge date: 07/30/2020  Time spent: 40 minutes  Recommendations for Outpatient Follow-up:  1. Follow outpatient CBC/CMP 2. Follow oxygen requirement outpatient - wean oxygen outpatient as tolerated 3. Quarantine per cdc guidelines - October 4 4. Follow blood sugars outpatient, A1c 5.2, suspect this should improve with cessation of steroids  Discharge Diagnoses:  Principal Problem:   Acute hypoxemic respiratory failure due to COVID-19 [facility]) Active Problems:   Sepsis (HCC)   Hypokalemia   Discharge Condition: stable  Diet recommendation: heart healthy  Filed Weights   07/27/20 0912 07/27/20 2310  Weight: 92.5 kg 94.6 kg    History of present illness:  Patient is Dylan Douglas 29 y.o. male with PMHx of IBS-who tested positive for COVID-19 on 9/13-but has been having symptoms for approximately 10 days prior to this hospitalization-presented to the hospital on 9/19 with worsening shortness of breath-found to have acute hypoxic respiratory failure secondary to COVID-19 pneumonia and admitted to the hospitalist service.  See below for further details.  He's improved on steroids, remdesivir.  Plan for discharge today and complete remdesivir outpatient.  Discharging with steroids with activities.  Hospital Course:  Acute Hypoxic Respiratory failure 2/2 COVID 19 Pneumonia CT 9/19 with multifocal covid 19 pneumonia On RA at rest, requiring 3 L with activity - discharged with 3 L with activity and at night S/p steroids and remdesivir - will complete remdesivir outpatient and taper steroids  COVID-19 Labs  Recent Labs    07/28/20 0414 07/29/20 0314 07/30/20 0925  DDIMER 0.38 0.53* 0.44  FERRITIN 1,184* 1,253* 1,202*  CRP 6.0* 1.9* 0.7    Lab Results  Component Value Date   SARSCOV2NAA Detected (Charie Pinkus) 07/21/2020   SARSCOV2NAA  Not Detected 07/17/2020   SARSCOV2NAA Not Detected 12/10/2019   SARSCOV2NAA Not Detected 10/02/2019   Elevated LFTs 2/2 covid, follow outpatient  IBS Stable  Oral thrush nystatin  Procedures:  none  Consultations:  none  Discharge Exam: Vitals:   07/30/20 0800 07/30/20 1235  BP:    Pulse:  69  Resp: 19   Temp:  98.6 F (37 C)  SpO2: 90% (!) 89%   Feeling better Eager to go home No new complaints  General: No acute distress. Cardiovascular: Heart sounds show Vibha Ferdig regular rate, and rhythm.  Lungs: Clear to auscultation bilaterally  Abdomen: Soft, nontender, nondistended Neurological: Alert and oriented 3. Moves all extremities 4 . Cranial nerves II through XII grossly intact. Skin: Warm and dry. No rashes or lesions. Extremities: No clubbing or cyanosis. No edema.   Discharge Instructions   Discharge Instructions    Call MD for:  difficulty breathing, headache or visual disturbances   Complete by: As directed    Call MD for:  extreme fatigue   Complete by: As directed    Call MD for:  hives   Complete by: As directed    Call MD for:  persistant dizziness or light-headedness   Complete by: As directed    Call MD for:  persistant nausea and vomiting   Complete by: As directed    Call MD for:  redness, tenderness, or signs of infection (pain, swelling, redness, odor or green/yellow discharge around incision site)   Complete by: As directed    Call MD for:  severe uncontrolled pain   Complete by: As directed    Call MD for:  temperature >100.4  Complete by: As directed    Diet - low sodium heart healthy   Complete by: As directed    Discharge instructions   Complete by: As directed    You were seen for Dylan Douglas COVID 19 infection.  You've improved with steroids and remdesivir.  You currently are requiring oxygen with activity.  Continue to use 3 L oxygen with activity and at night when you sleep.  You'll need to follow up with your PCP to determine how long  you'll need to continue this.    You'll need to quarantine until October 4th.  You can discontinue your isolation after this if you've continued to improve without the need for fever reducing medicines.  I'll write you Meranda Dechaine letter to stay out of work until after this date.  Please follow up with your PCP for additional recommendations on returning to work.  Your blood sugars were high on steroids.  Please follow your A1c and your blood sugars off steroids with your PCP.  Return for new, recurrent, or worsening symptoms.  Please ask your PCP to request records from this hospitalization so they know what was done and what the next steps will be.   Increase activity slowly   Complete by: As directed      Allergies as of 07/30/2020      Reactions   Flagyl [metronidazole] Anxiety      Medication List    STOP taking these medications   ibuprofen 200 MG tablet Commonly known as: ADVIL     TAKE these medications   acetaminophen 500 MG tablet Commonly known as: TYLENOL Take 500-1,000 mg by mouth every 6 (six) hours as needed for mild pain, fever or headache.   nystatin 100000 UNIT/ML suspension Commonly known as: MYCOSTATIN Use as directed 5 mLs in the mouth or throat See admin instructions. Swish/swallow 5 ml's by mouth three times Haruko Mersch day- as directed   ondansetron 4 MG disintegrating tablet Commonly known as: Zofran ODT Take 1 tablet (4 mg total) by mouth every 8 (eight) hours as needed for nausea or vomiting. What changed: reasons to take this   predniSONE 20 MG tablet Commonly known as: DELTASONE Take 2 tablets (40 mg total) by mouth daily for 2 days, THEN 1.5 tablets (30 mg total) daily for 1 day, THEN 1 tablet (20 mg total) daily for 1 day, THEN 0.5 tablets (10 mg total) daily for 1 day. Start taking on: July 30, 2020 What changed: See the new instructions.   ProAir HFA 108 (90 Base) MCG/ACT inhaler Generic drug: albuterol Inhale 2 puffs into the lungs See admin  instructions. Inhale 2 puffs into the lungs every 4-6 hours as needed for shortness of breath or wheezing   albuterol (2.5 MG/3ML) 0.083% nebulizer solution Commonly known as: PROVENTIL Take 2.5 mg by nebulization every 6 (six) hours as needed for wheezing or shortness of breath.   promethazine 50 MG tablet Commonly known as: PHENERGAN Take 50 mg by mouth 3 (three) times daily.            Durable Medical Equipment  (From admission, onward)         Start     Ordered   07/30/20 1142  DME Oxygen  Once       Comments: Patient resting on room air and o2 sats are 90%, ambulated patient on room air in the hallway, o2 sats dropped to 84%, applied 3l oxygen via nasal cannula to achieve 02 sats >90% with ambulation. After returning to  room and patient resting in bed, I was able to remove oxygen and o2 sats remained greater than 90% on room air at rest.  Patient requires 3 L O2 with activity due to desaturation <88% on RA with activity.  Question Answer Comment  Length of Need 6 Months   Mode or (Route) Nasal cannula   Liters per Minute 3   Frequency Continuous (stationary and portable oxygen unit needed)   Oxygen delivery system Gas      07/30/20 1146         Allergies  Allergen Reactions  . Flagyl [Metronidazole] Anxiety    Follow-up Information    [facility] Follow up.   Why: Providers of home oxygen Contact information: [address] [place] [place] [id number] [phone]                The results of significant diagnostics from this hospitalization (including imaging, microbiology, ancillary and laboratory) are listed below for reference.    Significant Diagnostic Studies: CT Angio Chest PE W and/or Wo Contrast  Result Date: 07/27/2020 CLINICAL DATA:  High probability of pulmonary embolus. Shortness of breath. Patient tested positive for COVID-19 July 21, 2020. EXAM: CT ANGIOGRAPHY CHEST WITH CONTRAST TECHNIQUE: Multidetector CT  imaging of the chest was performed using the standard protocol during bolus administration of intravenous contrast. Multiplanar CT image reconstructions and MIPs were obtained to evaluate the vascular anatomy. CONTRAST:  75mL OMNIPAQUE IOHEXOL 350 MG/ML SOLN COMPARISON:  Chest x-ray July 27, 2020 FINDINGS: Cardiovascular: Satisfactory opacification of the pulmonary arteries to the segmental level. No evidence of pulmonary embolism. Normal heart size. No pericardial effusion. Mediastinum/Nodes: No enlarged mediastinal, hilar, or axillary lymph nodes. Thyroid gland, trachea, and esophagus demonstrate no significant findings. Lungs/Pleura: Central airways are normal. No pneumothorax. No mediastinal air. Patchy bilateral pulmonary infiltrates, particularly in the peripheries of the lungs and most prominent the bases is consistent with multifocal pneumonia. Upper Abdomen: No acute abnormality. Musculoskeletal: No chest wall abnormality. No acute or significant osseous findings. Review of the MIP images confirms the above findings. IMPRESSION: 1. Multifocal COVID-19 pneumonia. 2. No pulmonary emboli. Electronically Signed   By: Gerome Samavid  Williams III M.D   On: 07/27/2020 14:05   DG Chest Portable 1 View  Result Date: 07/27/2020 CLINICAL DATA:  COVID-19 infection. EXAM: PORTABLE CHEST 1 VIEW COMPARISON:  Chest radiograph-02/05/2016 FINDINGS: Apparent enlargement of the cardiac silhouette is favored to be secondary to AP projection and decreased lung volumes. Interval development of peripheral and basilar predominant interstitial and airspace opacities. No pleural effusion or pneumothorax. No evidence of edema. No acute osseous abnormalities. IMPRESSION: Bilateral mid and lower lung interstitial and airspace opacities, nonspecific though compatible with provided history of COVID-19 infection Electronically Signed   By: Simonne ComeJohn  Watts M.D.   On: 07/27/2020 10:00    Microbiology: Recent Results (from the past 240  hour(s))  Novel Coronavirus, NAA ([facility])     Status: Abnormal   Collection Time: 07/21/20  1:26 PM   Specimen: Nasopharyngeal Swab; Nasopharyngeal(NP) swabs in vial transport medium   Nasopharynge  Result Value Ref Range Status   SARS-CoV-2, NAA Detected (Cephas Revard) Not Detected Final    Comment: Patients who have Zurri Rudden positive COVID-19 test result may now have treatment options. Treatment options are available for patients with mild to moderate symptoms and for hospitalized patients. Visit our website at [email or web address] for resources and information. This nucleic acid amplification test was developed and its performance characteristics determined by [facility]. Nucleic acid  amplification tests include RT-PCR and TMA. This test has not been FDA cleared or approved. This test has been authorized by FDA under an Emergency Use Authorization (EUA). This test is only authorized for the duration of time the declaration that circumstances exist justifying the authorization of the emergency use of in vitro diagnostic tests for detection of SARS-CoV-2 virus and/or diagnosis of COVID-19 infection under section 564(b)(1) of the Act, 21 U.S.C. 161WRU-0(Burleigh Brockmann) (1), unless the authorization is terminated or revoked sooner. When diagnostic testing is negativ e, the possibility of Lasaundra Riche false negative result should be considered in the context of Debarah Mccumbers patient's recent exposures and the presence of clinical signs and symptoms consistent with COVID-19. An individual without symptoms of COVID-19 and who is not shedding SARS-CoV-2 virus would expect to have Takao Lizer negative (not detected) result in this assay.   SARS-COV-2, NAA 2 DAY TAT     Status: None   Collection Time: 07/21/20  1:26 PM   Nasopharynge  Result Value Ref Range Status   SARS-CoV-2, NAA 2 DAY TAT Performed  Final  Culture, blood (Routine X 2) w Reflex to ID Panel     Status: None (Preliminary result)   Collection Time: 07/27/20  2:46  PM   Specimen: BLOOD  Result Value Ref Range Status   Specimen Description BLOOD RIGHT ANTECUBITAL  Final   Special Requests   Final    BOTTLES DRAWN AEROBIC AND ANAEROBIC Blood Culture results may not be optimal due to an inadequate volume of blood received in culture bottles   Culture   Final    NO GROWTH 3 DAYS Performed at [facility], [address] [place], [place] [address]    Report Status PENDING  Incomplete  Culture, blood (Routine X 2) w Reflex to ID Panel     Status: None (Preliminary result)   Collection Time: 07/27/20  5:09 PM   Specimen: BLOOD LEFT HAND  Result Value Ref Range Status   Specimen Description BLOOD LEFT HAND  Final   Special Requests   Final    BOTTLES DRAWN AEROBIC AND ANAEROBIC Blood Culture adequate volume   Culture   Final    NO GROWTH 3 DAYS Performed at [facility], [address], [place], [place] [address]    Report Status PENDING  Incomplete     Labs: Basic Metabolic Panel: Recent Labs  Lab 07/27/20 0925 07/27/20 1655 07/28/20 0414 07/29/20 0314 07/30/20 0925  NA 141  --  140 141 139  K 3.4*  --  4.2 4.0 4.1  CL 102  --  105 106 102  CO2 25  --  GLUCOSE 107*  --  142* 159* 168*  BUN 15  --  CREATININE 1.14 1.13 0.96 0.93 0.93  CALCIUM 8.4*  --  8.8* 9.1 8.9  MG  --  2.1 2.2 2.5* 2.8*  PHOS  --   --  4.0  --   --    Liver Function Tests: Recent Labs  Lab 07/27/20 0925 07/28/20 0414 07/29/20 0314 07/30/20 0925  AST 42* 52* 45* 24  ALT 36 50* 60* 49*  ALKPHOS 28* 26* 30* 29*  BILITOT 0.7 0.6 0.6 0.4  PROT 6.8 7.0 6.9 6.3*  ALBUMIN 3.5 3.4* 3.4* 3.4*   No results for input(s): LIPASE, AMYLASE in the last 168 hours. No results for input(s): AMMONIA in the last 168 hours. CBC: Recent Labs  Lab 07/27/20 0925 07/27/20 1655 07/28/20  3734 07/29/20 0314 07/30/20 0925  WBC 8.6 7.9 4.8 10.8* 13.8*  NEUTROABS 6.5  --  3.9 9.0* 11.9*  HGB 15.7 15.7 15.1 15.1 15.4  HCT 44.2 45.9 42.6 43.6  44.6  MCV 86.7 86.8 86.1 84.8 85.9  PLT 264 254 296 347 398   Cardiac Enzymes: No results for input(s): CKTOTAL, CKMB, CKMBINDEX, TROPONINI in the last 168 hours. BNP: BNP (last 3 results) Recent Labs    07/27/20 1655  BNP 18.3    ProBNP (last 3 results) No results for input(s): PROBNP in the last 8760 hours.  CBG: Recent Labs  Lab 07/29/20 0742  GLUCAP 172*       Signed:  Lacretia Nicks MD.  [facility] 07/30/2020, 8:37 PM

## 2020-07-30 NOTE — Discharge Instructions (Signed)
Patient scheduled for outpatient Remdesivir infusion at 2:30pm on Thursday 9/23 at  Hospital. Please inform the patient to park at [address], [place], as staff will be escorting the patient through the east entrance of the hospital. Appointments take approximately 45 minutes.    There is a wave flag banner located near the entrance on [address] Turn into this entrance and immediately turn left and park in 1 of the 5 designated Covid Infusion Parking spots. There is a phone number on the sign, please call and let the staff know what spot you are in and we will come out and get you. For questions call [phone].  Thanks.   

## 2020-07-30 NOTE — Progress Notes (Signed)
Patient scheduled for outpatient Remdesivir infusion at 2:30pm on Thursday 9/23 at [facility]. Please inform the patient to park at [address] [place], [place], as staff will be escorting the patient through the east entrance of the hospital. Appointments take approximately 45 minutes.    There is a wave flag banner located near the entrance on [address]. Turn into this entrance and immediately turn left and park in 1 of the 5 designated Covid Infusion Parking spots. There is a phone number on the sign, please call and let the staff know what spot you are in and we will come out and get you. For questions call [phone].  Thanks.

## 2020-07-30 NOTE — Progress Notes (Addendum)
Patient resting on room air and o2 sats are 90%, ambulated patient on room air in the hallway, o2 sats dropped to 84%, applied 3l oxygen via nasal cannula to achieve an 02 sat of 90% with ambulation. After returning to room and patient resting in bed, I was able to remove oxygen and o2 sats remained 91% on room air at rest.

## 2020-07-30 NOTE — TOC Transition Note (Signed)
Transition of Care [facility]) - CM/SW Discharge Note   Patient Details  Name: Dylan Douglas MRN: [id number] Date of Birth: December 18, 1990  [facility]) CM/SW Contact:  Lawerance Sabal, RN Phone Number: 07/30/2020, 12:28 PM   Clinical Narrative:   Sherron Monday w patient on the phone. Plan is for patient to DC to home today with home oxygen. Family to provide transport home. Referral made to [facility] for delivery of POC to room today. Patient may DC after POC delivered.     Final next level of care: Home/Self Care Barriers to Discharge: No Barriers Identified   Patient Goals and CMS Choice        Discharge Placement                       Discharge Plan and Services                DME Arranged: Oxygen DME Agency: [facility] Date DME Agency Contacted: 07/30/20 Time DME Agency Contacted: 1228 Representative spoke with at DME Agency: Ian Malkin            Social Determinants of Health (SDOH) Interventions     Readmission Risk Interventions No flowsheet data found.

## 2020-07-31 ENCOUNTER — Ambulatory Visit (HOSPITAL_COMMUNITY)
Admission: RE | Admit: 2020-07-31 | Discharge: 2020-07-31 | Disposition: A | Discharge status: Home / Self Care | Payer: Managed Care, Other (non HMO) | Source: Ambulatory Visit | Attending: Pulmonary Disease | Admitting: Pulmonary Disease

## 2020-07-31 ENCOUNTER — Ambulatory Visit (HOSPITAL_COMMUNITY): Payer: Managed Care, Other (non HMO)

## 2020-07-31 DIAGNOSIS — U071 COVID-19: Secondary | ICD-10-CM | POA: Insufficient documentation

## 2020-07-31 MED ORDER — FAMOTIDINE IN NACL 20-0.9 MG/50ML-% IV SOLN: 20.0000 mg | Freq: Once | INTRAVENOUS | Status: DC | PRN

## 2020-07-31 MED ORDER — EPINEPHRINE 0.3 MG/0.3ML IJ SOAJ: 0.3000 mg | Freq: Once | INTRAMUSCULAR | Status: DC | PRN

## 2020-07-31 MED ORDER — METHYLPREDNISOLONE SODIUM SUCC 125 MG IJ SOLR: 125.0000 mg | Freq: Once | INTRAMUSCULAR | Status: DC | PRN

## 2020-07-31 MED ORDER — SODIUM CHLORIDE 0.9 % IV SOLN: INTRAVENOUS | Status: DC | PRN

## 2020-07-31 MED ORDER — ALBUTEROL SULFATE HFA 108 (90 BASE) MCG/ACT IN AERS: 2.0000 | INHALATION_SPRAY | Freq: Once | RESPIRATORY_TRACT | Status: DC | PRN

## 2020-07-31 MED ORDER — SODIUM CHLORIDE 0.9 % IV SOLN
100.0000 mg | Freq: Once | INTRAVENOUS | Status: AC
  Administered 2020-07-31: 100 mg via INTRAVENOUS
  Filled 2020-07-31: qty 20

## 2020-07-31 MED ORDER — DIPHENHYDRAMINE HCL 50 MG/ML IJ SOLN: 50.0000 mg | Freq: Once | INTRAMUSCULAR | Status: DC | PRN

## 2020-07-31 NOTE — Progress Notes (Signed)
°  Diagnosis: COVID-19  Physician: Patrick Wright, MD  Procedure: Covid Infusion Clinic Med: remdesivir infusion - Provided patient with remdesivir fact sheet for patients, parents and caregivers prior to infusion.  Complications: No immediate complications noted.  Discharge: Discharged home   Dylan Douglas 07/31/2020   

## 2020-07-31 NOTE — Discharge Instructions (Signed)
10 Things You Can Do to Manage Your COVID-19 Symptoms at Home If you have possible or confirmed COVID-19: 1. Stay home from work and school. And stay away from other public places. If you must go out, avoid using any kind of public transportation, ridesharing, or taxis. 2. Monitor your symptoms carefully. If your symptoms get worse, call your healthcare provider immediately. 3. Get rest and stay hydrated. 4. If you have a medical appointment, call the healthcare provider ahead of time and tell them that you have or may have COVID-19. 5. For medical emergencies, call 911 and notify the dispatch personnel that you have or may have COVID-19. 6. Cover your cough and sneezes with a tissue or use the inside of your elbow. 7. Wash your hands often with soap and water for at least 20 seconds or clean your hands with an alcohol-based hand sanitizer that contains at least 60% alcohol. 8. As much as possible, stay in a specific room and away from other people in your home. Also, you should use a separate bathroom, if available. If you need to be around other people in or outside of the home, wear a mask. 9. Avoid sharing personal items with other people in your household, like dishes, towels, and bedding. 10. Clean all surfaces that are touched often, like counters, tabletops, and doorknobs. Use household cleaning sprays or wipes according to the label instructions. cdc.gov/coronavirus 05/09/2019 This information is not intended to replace advice given to you by your health care provider. Make sure you discuss any questions you have with your health care provider. Document Revised: 10/11/2019 Document Reviewed: 10/11/2019 Elsevier Patient Education  2020 Elsevier Inc.  

## 2020-08-02 LAB — CULTURE, BLOOD (ROUTINE X 2)
Culture: NO GROWTH
Culture: NO GROWTH
Special Requests: ADEQUATE

## 2020-08-14 ENCOUNTER — Other Ambulatory Visit: Payer: Self-pay

## 2020-08-14 ENCOUNTER — Encounter (HOSPITAL_BASED_OUTPATIENT_CLINIC_OR_DEPARTMENT_OTHER): Payer: Self-pay | Admitting: *Deleted

## 2020-08-14 ENCOUNTER — Emergency Department (HOSPITAL_BASED_OUTPATIENT_CLINIC_OR_DEPARTMENT_OTHER): Payer: Managed Care, Other (non HMO)

## 2020-08-14 ENCOUNTER — Observation Stay (HOSPITAL_BASED_OUTPATIENT_CLINIC_OR_DEPARTMENT_OTHER)
Admission: EM | Admit: 2020-08-14 | Discharge: 2020-08-15 | Disposition: A | Discharge status: Home / Self Care | Payer: Managed Care, Other (non HMO) | Attending: Internal Medicine | Admitting: Internal Medicine

## 2020-08-14 DIAGNOSIS — Z8616 Personal history of COVID-19: Secondary | ICD-10-CM | POA: Insufficient documentation

## 2020-08-14 DIAGNOSIS — I2699 Other pulmonary embolism without acute cor pulmonale: Secondary | ICD-10-CM | POA: Diagnosis not present

## 2020-08-14 DIAGNOSIS — R0602 Shortness of breath: Secondary | ICD-10-CM | POA: Diagnosis present

## 2020-08-14 DIAGNOSIS — Z87891 Personal history of nicotine dependence: Secondary | ICD-10-CM | POA: Insufficient documentation

## 2020-08-14 DIAGNOSIS — Z8701 Personal history of pneumonia (recurrent): Secondary | ICD-10-CM | POA: Insufficient documentation

## 2020-08-14 HISTORY — DX: COVID-19: U07.1

## 2020-08-14 LAB — BASIC METABOLIC PANEL
Anion gap: 9 (ref 5–15)
BUN: 10 mg/dL (ref 6–20)
CO2: 28 mmol/L (ref 22–32)
Calcium: 9.1 mg/dL (ref 8.9–10.3)
Chloride: 99 mmol/L (ref 98–111)
Creatinine, Ser: 0.72 mg/dL (ref 0.61–1.24)
GFR calc non Af Amer: >60 mL/min (ref >60)
Glucose, Bld: 113 mg/dL — ABNORMAL HIGH (ref 70–99)
Potassium: 3.9 mmol/L (ref 3.5–5.1)
Sodium: 136 mmol/L (ref 135–145)

## 2020-08-14 LAB — CBC WITH DIFFERENTIAL/PLATELET
Abs Immature Granulocytes: 0.04 10*3/uL (ref 0.00–0.07)
Basophils Absolute: 0 10*3/uL (ref 0.0–0.1)
Basophils Relative: 0 %
Eosinophils Absolute: 0.1 10*3/uL (ref 0.0–0.5)
Eosinophils Relative: 1 %
HCT: 45.1 % (ref 39.0–52.0)
Hemoglobin: 15.8 g/dL (ref 13.0–17.0)
Immature Granulocytes: 1 %
Lymphocytes Relative: 21 %
Lymphs Abs: 1.8 10*3/uL (ref 0.7–4.0)
MCH: 30.3 pg (ref 26.0–34.0)
MCHC: 35 g/dL (ref 30.0–36.0)
MCV: 86.4 fL (ref 80.0–100.0)
Monocytes Absolute: 1 10*3/uL (ref 0.1–1.0)
Monocytes Relative: 11 %
Neutro Abs: 5.7 10*3/uL (ref 1.7–7.7)
Neutrophils Relative %: 66 %
Platelets: 217 10*3/uL (ref 150–400)
RBC: 5.22 MIL/uL (ref 4.22–5.81)
RDW: 13.4 % (ref 11.5–15.5)
WBC: 8.7 10*3/uL (ref 4.0–10.5)
nRBC: 0 % (ref 0.0–0.2)

## 2020-08-14 LAB — TROPONIN I (HIGH SENSITIVITY): Troponin I (High Sensitivity): 3 ng/L (ref <18)

## 2020-08-14 MED ORDER — MELATONIN 5 MG PO TABS
10.0000 mg | ORAL_TABLET | Freq: Every evening | ORAL | Status: DC | PRN
  Filled 2020-08-14: qty 2

## 2020-08-14 MED ORDER — ALBUTEROL SULFATE HFA 108 (90 BASE) MCG/ACT IN AERS: 2.0000 | INHALATION_SPRAY | RESPIRATORY_TRACT | Status: DC | PRN

## 2020-08-14 MED ORDER — ONDANSETRON HCL 4 MG/2ML IJ SOLN: 4.0000 mg | Freq: Four times a day (QID) | INTRAMUSCULAR | Status: DC | PRN

## 2020-08-14 MED ORDER — IOHEXOL 350 MG/ML SOLN
100.0000 mL | Freq: Once | INTRAVENOUS | Status: AC | PRN
  Administered 2020-08-14: 100 mL via INTRAVENOUS

## 2020-08-14 MED ORDER — HEPARIN BOLUS VIA INFUSION
4500.0000 [IU] | Freq: Once | INTRAVENOUS | Status: AC
  Administered 2020-08-14: 4500 [IU] via INTRAVENOUS

## 2020-08-14 MED ORDER — POLYETHYLENE GLYCOL 3350 17 G PO PACK
17.0000 g | PACK | Freq: Every day | ORAL | Status: DC | PRN
  Filled 2020-08-14: qty 1

## 2020-08-14 MED ORDER — HEPARIN (PORCINE) 25000 UT/250ML-% IV SOLN
1500.0000 [IU]/h | INTRAVENOUS | Status: DC
  Administered 2020-08-14 – 2020-08-15 (×2): 1500 [IU]/h via INTRAVENOUS
  Filled 2020-08-14 (×2): qty 250

## 2020-08-14 MED ORDER — ACETAMINOPHEN 325 MG PO TABS: 650.0000 mg | ORAL_TABLET | ORAL | Status: DC | PRN

## 2020-08-14 MED ORDER — SODIUM CHLORIDE 0.9 % IV BOLUS
1000.0000 mL | Freq: Once | INTRAVENOUS | Status: AC
  Administered 2020-08-14: 1000 mL via INTRAVENOUS

## 2020-08-14 NOTE — ED Provider Notes (Signed)
Care assumed from [facility], please see her note for full details, but in brief Dylan Douglas is a 29 y.o. male presents with a week of left lower posterior rib pain that is pleuritic in nature. Patient had recent Covid infection and required hospitalization for acute hypoxic respiratory failure. Has recovered from Covid and is not currently on any oxygen. Noted to be tachycardic on arrival. Concern for potential PE, lab work is unremarkable. CTA pending at shift change.  BP (!) 141/77   Pulse (!) 125   Temp 98.8 F (37.1 C)   Resp 18   Ht 5\' 9"  (1.753 m)   Wt 93 kg   SpO2 97%   BMI 30.27 kg/m   ED Course/Procedures   Labs Reviewed  BASIC METABOLIC PANEL - Abnormal; Notable for the following components:      Result Value   Glucose, Bld 113 (*)    All other components within normal limits  CBC WITH DIFFERENTIAL/PLATELET   DG Chest 2 View  Result Date: 08/14/2020 CLINICAL DATA:  COVID back pain EXAM: CHEST - 2 VIEW COMPARISON:  07/27/2020 FINDINGS: The heart size and mediastinal contours are within normal limits. Both lungs are clear. The visualized skeletal structures are unremarkable. Clearing of previously noted pneumonia. IMPRESSION: No active cardiopulmonary disease. Electronically Signed   By: 07/29/2020 M.D.   On: 08/14/2020 16:41   CT Angio Chest PE W and/or Wo Contrast  Result Date: 08/14/2020 CLINICAL DATA:  29 year old male with concern for pulmonary embolism. EXAM: CT ANGIOGRAPHY CHEST WITH CONTRAST TECHNIQUE: Multidetector CT imaging of the chest was performed using the standard protocol during bolus administration of intravenous contrast. Multiplanar CT image reconstructions and MIPs were obtained to evaluate the vascular anatomy. CONTRAST:  30 OMNIPAQUE IOHEXOL 350 MG/ML SOLN COMPARISON:  Chest radiograph dated 08/14/2020. chest CT dated 07/27/2020. FINDINGS: Evaluation of this exam is limited due to respiratory motion artifact. Cardiovascular: There is no  cardiomegaly or pericardial effusion. The thoracic aorta is unremarkable. The origins of the great vessels of the aortic arch appear patent. There is mild prominence of the main pulmonary trunk suggestive of a degree of pulmonary hypertension. Evaluation of the pulmonary arteries is limited due to respiratory motion artifact and suboptimal opacification and timing of the contrast. There are however bilateral lower lobe subsegmental pulmonary artery emboli as well as left upper lobe segmental branch point embolus (125/10). No CT findings of right heart straining. Mediastinum/Nodes: No hilar or mediastinal adenopathy. The esophagus and the thyroid gland are grossly unremarkable. No mediastinal fluid collection. Lungs/Pleura: Linear and streaky subpleural density at the left lung base may represent atelectasis or scarring or possibly a small area of infarct. No focal consolidation, pleural effusion, or pneumothorax. The central airways are patent. Upper Abdomen: No acute abnormality. Musculoskeletal: No chest wall abnormality. No acute or significant osseous findings. Review of the MIP images confirms the above findings. IMPRESSION: 1. Bilateral pulmonary artery emboli. No CT findings of right heart straining. 2. Left lung base subpleural atelectasis or scarring or possibly a small area of infarct. These results were called by telephone at the time of interpretation on 08/14/2020 at 7:11 pm to provider 10/14/2020, who verbally acknowledged these results. Electronically Signed   By: Jodi Geralds M.D.   On: 08/14/2020 19:13    Procedures  MDM   Called by radiology regarding CT results, patient with bilateral pulmonary artery emboli, no evidence of right heart strain.  There is also some subpleural atelectasis or scarring  or possible small area of infarct in the left lung base.    Patient with bilateral PEs in the setting of recent Covid infection, will start on heparin, patient placed on continuous cardiac  monitoring and pulse ox, and will require hospital admission.  Case discussed with Dr. Leafy Half with [facility] who will see and admit the patient.       Dartha Lodge, PA-C 08/14/20 2004    Vanetta Mulders, MD 08/15/20 1527

## 2020-08-14 NOTE — ED Triage Notes (Addendum)
C/o SOb and lower left back pain , COvid 9/17.

## 2020-08-14 NOTE — ED Provider Notes (Signed)
[facility] EMERGENCY DEPARTMENT Provider Note   CSN: [id number] Arrival date & time: 08/14/20  1550     History Chief Complaint  Patient presents with  . Shortness of Breath    Dylan Douglas is a 29 y.o. male.  Who presents emergency department with chief complaint of left sided pleuritic chest pain.  Patient developed symptoms of COVID-19 on 07/21/2020 and was admitted to the hospital for acute hypoxic respiratory failure due to COVID-19 on the 19th of 2021 with Covid pneumonia.  Patient was discharged on the 22nd.  He states that he has been doing well up until 1 week ago when he started having left lower posterior rib cage pain which is sharp, pleuritic, worse with deep breathing.  He denies overt shortness of breath any worse than usual since he has had the Covid infection.  Or syncope. HPI     Past Medical History:  Diagnosis Date  . COVID-19   . IBS (irritable bowel syndrome)   . Palpitations     Patient Active Problem List   Diagnosis Date Noted  . Acute hypoxemic respiratory failure due to COVID-19 (HCC) 07/27/2020  . Sepsis (HCC) 07/27/2020  . Hypokalemia 07/27/2020  . IBS (irritable bowel syndrome) 05/27/2015  . Palpitations 07/31/2013    History reviewed. No pertinent surgical history.     Family History  Problem Relation Age of Onset  . Breast cancer Mother   . Cancer Mother     Social History   Tobacco Use  . Smoking status: Former Smoker    Types: Cigars, Cigarettes  . Smokeless tobacco: Never Used  Substance Use Topics  . Alcohol use: Yes    Alcohol/week: 0.0 standard drinks    Comment: Occasionally  . Drug use: No    Home Medications Prior to Admission medications   Medication Sig Start Date End Date Taking? Authorizing Provider  acetaminophen (TYLENOL) 500 MG tablet Take 500-1,000 mg by mouth every 6 (six) hours as needed for mild pain, fever or headache.    [provider]  albuterol (PROAIR HFA) 108 (90 Base) MCG/ACT  inhaler Inhale 2 puffs into the lungs See admin instructions. Inhale 2 puffs into the lungs every 4-6 hours as needed for shortness of breath or wheezing    [provider]  albuterol (PROVENTIL) (2.5 MG/3ML) 0.083% nebulizer solution Take 2.5 mg by nebulization every 6 (six) hours as needed for wheezing or shortness of breath.    [provider]  nystatin (MYCOSTATIN) 100000 UNIT/ML suspension Use as directed 5 mLs in the mouth or throat See admin instructions. Swish/swallow 5 ml's by mouth three times a day- as directed    [provider]  ondansetron (ZOFRAN ODT) 4 MG disintegrating tablet Take 1 tablet (4 mg total) by mouth every 8 (eight) hours as needed for nausea or vomiting. Patient taking differently: Take 4 mg by mouth every 8 (eight) hours as needed for nausea or vomiting (DISSOLVE ORALLY).  07/21/20   Belinda Fisher, PA-C  promethazine (PHENERGAN) 50 MG tablet Take 50 mg by mouth 3 (three) times daily.    [provider]  dicyclomine (BENTYL) 20 MG tablet Take 1 tablet (20 mg total) by mouth 2 (two) times daily. Patient not taking: Reported on 04/30/2018 05/19/16 07/13/19  Gerhard Munch, MD  metoCLOPramide (REGLAN) 10 MG tablet Take 1 tablet (10 mg total) by mouth 3 (three) times daily as needed for nausea (headache / nausea). Patient not taking: Reported on 04/30/2018 02/06/16 07/13/19  Eber Hong, MD    Allergies    Flagyl [metronidazole]  Review of Systems   Review of Systems Ten systems reviewed and are negative for acute change, except as noted in the HPI.  Physical Exam Updated Vital Signs BP (!) 141/77   Pulse (!) 125   Temp 98.8 F (37.1 C)   Resp 18   Ht 5\' 9"  (1.753 m)   Wt 93 kg   SpO2 97%   BMI 30.27 kg/m   Physical Exam Vitals and nursing note reviewed.  Constitutional:      General: He is not in acute distress.    Appearance: He is well-developed. He is not diaphoretic.  HENT:     Head: Normocephalic and atraumatic.  Eyes:      General: No scleral icterus.    Conjunctiva/sclera: Conjunctivae normal.  Cardiovascular:     Rate and Rhythm: Regular rhythm. Tachycardia present.     Heart sounds: Normal heart sounds.  Pulmonary:     Effort: Pulmonary effort is normal. No respiratory distress.     Breath sounds: Normal breath sounds.  Abdominal:     Palpations: Abdomen is soft.     Tenderness: There is no abdominal tenderness.  Musculoskeletal:     Cervical back: Normal range of motion and neck supple.  Skin:    General: Skin is warm and dry.  Neurological:     Mental Status: He is alert.  Psychiatric:        Behavior: Behavior normal.     ED Results / Procedures / Treatments   Labs (all labs ordered are listed, but only abnormal results are displayed) Labs Reviewed  BASIC METABOLIC PANEL  CBC WITH DIFFERENTIAL/PLATELET    EKG None  Radiology DG Chest 2 View  Result Date: 08/14/2020 CLINICAL DATA:  COVID back pain EXAM: CHEST - 2 VIEW COMPARISON:  07/27/2020 FINDINGS: The heart size and mediastinal contours are within normal limits. Both lungs are clear. The visualized skeletal structures are unremarkable. Clearing of previously noted pneumonia. IMPRESSION: No active cardiopulmonary disease. Electronically Signed   By: 07/29/2020 M.D.   On: 08/14/2020 16:41    Procedures Procedures (including critical care time)  Medications Ordered in ED Medications - No data to display  ED Course  I have reviewed the triage vital signs and the nursing notes.  Pertinent labs & imaging results that were available during my care of the patient were reviewed by me and considered in my medical decision making (see chart for details).    MDM Rules/Calculators/A&P                           29 y/o male here with left sided pleuritic cp  X 1 week.  HR 125 . ? PE CTA pending  Labs without sig abnormality. I have given sign out to [facility].  Final Clinical Impression(s) / ED Diagnoses Final diagnoses:    None    Rx / DC Orders ED Discharge Orders    None       [facility], PA-C 08/14/20 1850    10/14/20, MD 08/15/20 1525

## 2020-08-14 NOTE — H&P (Signed)
History and Physical    MEADE HOGELAND OZD:[id number] DOB: [birth year] DOA: 08/14/2020  PCP: Darrow Bussing, MD  Patient coming from: Home.  Chief Complaint: Chest pain and shortness of breath.  HPI: Dylan Douglas is a 29 y.o. male with recently treated for COVID-19 infection discharged on July 30, 2020 2 weeks ago started experiencing chest pain mostly in the left upper back with shortness of breath over the last 24 hours.  Denies any fever chills productive cough dizziness or palpitations.  Presents to the ER admits in [facility].  Patient denies any family history or personal history of pulmonary embolism or DVT.  ED Course: CT angiogram of the chest shows bilateral pulmonary embolism with no strain pattern.  EKG shows T wave inversion in the V1.  Otherwise high sensitive troponin was negative.  Patient was started on heparin and admitted for further observation.  Review of Systems: As per HPI, rest all negative.   Past Medical History:  Diagnosis Date  . COVID-19   . IBS (irritable bowel syndrome)   . Palpitations     History reviewed. No pertinent surgical history.   reports that he has quit smoking. His smoking use included cigars and cigarettes. He has never used smokeless tobacco. He reports current alcohol use. He reports that he does not use drugs.  Allergies  Allergen Reactions  . Flagyl [Metronidazole] Anxiety    Family History  Problem Relation Age of Onset  . Breast cancer Mother   . Cancer Mother     Prior to Admission medications   Medication Sig Start Date End Date Taking? Authorizing Provider  acetaminophen (TYLENOL) 500 MG tablet Take 500-1,000 mg by mouth every 6 (six) hours as needed for mild pain, fever or headache.   Yes [provider]  albuterol (PROAIR HFA) 108 (90 Base) MCG/ACT inhaler Inhale 2 puffs into the lungs See admin instructions. Inhale 2 puffs into the lungs every 4-6 hours as needed for shortness of breath or wheezing    Yes [provider]  albuterol (PROVENTIL) (2.5 MG/3ML) 0.083% nebulizer solution Take 2.5 mg by nebulization every 6 (six) hours as needed for wheezing or shortness of breath.   Yes [provider]  ondansetron (ZOFRAN ODT) 4 MG disintegrating tablet Take 1 tablet (4 mg total) by mouth every 8 (eight) hours as needed for nausea or vomiting. Patient not taking: Reported on 08/14/2020 07/21/20   Belinda Fisher, PA-C  dicyclomine (BENTYL) 20 MG tablet Take 1 tablet (20 mg total) by mouth 2 (two) times daily. Patient not taking: Reported on 04/30/2018 05/19/16 07/13/19  Gerhard Munch, MD  metoCLOPramide (REGLAN) 10 MG tablet Take 1 tablet (10 mg total) by mouth 3 (three) times daily as needed for nausea (headache / nausea). Patient not taking: Reported on 04/30/2018 02/06/16 07/13/19  Eber Hong, MD    Physical Exam: Constitutional: Moderately built and nourished. Vitals:   08/14/20 2006 08/14/20 2015 08/14/20 2030 08/14/20 2210  BP: 119/76 123/88 (!) 131/94 140/82  Pulse: 95 (!) 103 96 81  Resp: 12 16 (!) 23 14  Temp:   98.5 F (36.9 C) 99.1 F (37.3 C)  TempSrc:   Oral Oral  SpO2: 99% 99% 98% 98%  Weight:      Height:       Eyes: Anicteric no pallor. ENMT: No discharge from the ears eyes nose or mouth. Neck: No mass felt.  No neck rigidity. Respiratory: No rhonchi or crepitations. Cardiovascular: S1-S2 heard. Abdomen: Soft nontender  bowel sounds present. Musculoskeletal: No edema. Skin: No rash. Neurologic: Alert awake oriented to time place and person.  Moves all extremities. Psychiatric: Appears normal.  Normal affect.   Labs on Admission: I have personally reviewed following labs and imaging studies  CBC: Recent Labs  Lab 08/14/20 1713  WBC 8.7  NEUTROABS 5.7  HGB 15.8  HCT 45.1  MCV 86.4  PLT 217   Basic Metabolic Panel: Recent Labs  Lab 08/14/20 1713  NA 136  K 3.9  CL 99  CO2 28  GLUCOSE 113*  BUN 10  CREATININE 0.72  CALCIUM 9.1    GFR: Estimated Creatinine Clearance: 157.5 mL/min (by C-G formula based on SCr of 0.72 mg/dL). Liver Function Tests: No results for input(s): AST, ALT, ALKPHOS, BILITOT, PROT, ALBUMIN in the last 168 hours. No results for input(s): LIPASE, AMYLASE in the last 168 hours. No results for input(s): AMMONIA in the last 168 hours. Coagulation Profile: No results for input(s): INR, PROTIME in the last 168 hours. Cardiac Enzymes: No results for input(s): CKTOTAL, CKMB, CKMBINDEX, TROPONINI in the last 168 hours. BNP (last 3 results) No results for input(s): PROBNP in the last 8760 hours. HbA1C: No results for input(s): HGBA1C in the last 72 hours. CBG: No results for input(s): GLUCAP in the last 168 hours. Lipid Profile: No results for input(s): CHOL, HDL, LDLCALC, TRIG, CHOLHDL, LDLDIRECT in the last 72 hours. Thyroid Function Tests: No results for input(s): TSH, T4TOTAL, FREET4, T3FREE, THYROIDAB in the last 72 hours. Anemia Panel: No results for input(s): VITAMINB12, FOLATE, FERRITIN, TIBC, IRON, RETICCTPCT in the last 72 hours. Urine analysis:    Component Value Date/Time   COLORURINE YELLOW 07/27/2020 1245   APPEARANCEUR HAZY (A) 07/27/2020 1245   LABSPEC 1.032 (H) 07/27/2020 1245   PHURINE 6.0 07/27/2020 1245   GLUCOSEU NEGATIVE 07/27/2020 1245   HGBUR NEGATIVE 07/27/2020 1245   BILIRUBINUR NEGATIVE 07/27/2020 1245   KETONESUR 5 (A) 07/27/2020 1245   PROTEINUR 30 (A) 07/27/2020 1245   NITRITE NEGATIVE 07/27/2020 1245   LEUKOCYTESUR NEGATIVE 07/27/2020 1245   Sepsis Labs: @LABRCNTIP (procalcitonin:4,lacticidven:4) )No results found for this or any previous visit (from the past 240 hour(s)).   Radiological Exams on Admission: DG Chest 2 View  Result Date: 08/14/2020 CLINICAL DATA:  COVID back pain EXAM: CHEST - 2 VIEW COMPARISON:  07/27/2020 FINDINGS: The heart size and mediastinal contours are within normal limits. Both lungs are clear. The visualized skeletal structures  are unremarkable. Clearing of previously noted pneumonia. IMPRESSION: No active cardiopulmonary disease. Electronically Signed   By: 07/29/2020 M.D.   On: 08/14/2020 16:41   CT Angio Chest PE W and/or Wo Contrast  Result Date: 08/14/2020 CLINICAL DATA:  29 year old male with concern for pulmonary embolism. EXAM: CT ANGIOGRAPHY CHEST WITH CONTRAST TECHNIQUE: Multidetector CT imaging of the chest was performed using the standard protocol during bolus administration of intravenous contrast. Multiplanar CT image reconstructions and MIPs were obtained to evaluate the vascular anatomy. CONTRAST:  30 OMNIPAQUE IOHEXOL 350 MG/ML SOLN COMPARISON:  Chest radiograph dated 08/14/2020. chest CT dated 07/27/2020. FINDINGS: Evaluation of this exam is limited due to respiratory motion artifact. Cardiovascular: There is no cardiomegaly or pericardial effusion. The thoracic aorta is unremarkable. The origins of the great vessels of the aortic arch appear patent. There is mild prominence of the main pulmonary trunk suggestive of a degree of pulmonary hypertension. Evaluation of the pulmonary arteries is limited due to respiratory motion artifact and suboptimal opacification and timing of the contrast. There  are however bilateral lower lobe subsegmental pulmonary artery emboli as well as left upper lobe segmental branch point embolus (125/10). No CT findings of right heart straining. Mediastinum/Nodes: No hilar or mediastinal adenopathy. The esophagus and the thyroid gland are grossly unremarkable. No mediastinal fluid collection. Lungs/Pleura: Linear and streaky subpleural density at the left lung base may represent atelectasis or scarring or possibly a small area of infarct. No focal consolidation, pleural effusion, or pneumothorax. The central airways are patent. Upper Abdomen: No acute abnormality. Musculoskeletal: No chest wall abnormality. No acute or significant osseous findings. Review of the MIP images confirms the  above findings. IMPRESSION: 1. Bilateral pulmonary artery emboli. No CT findings of right heart straining. 2. Left lung base subpleural atelectasis or scarring or possibly a small area of infarct. These results were called by telephone at the time of interpretation on 08/14/2020 at 7:11 pm to provider Jodi Geralds, who verbally acknowledged these results. Electronically Signed   By: Elgie Collard M.D.   On: 08/14/2020 19:13    EKG: Independently reviewed.  Normal sinus rhythm with T wave inversions in V1.  Assessment/Plan Principal Problem:   Acute pulmonary embolism without acute cor pulmonale (HCC) Active Problems:   Pulmonary embolism (HCC)    1. Acute bilateral pulmonary embolism hemodynamically stable likely unprovoked could be from recent Covid infection.  Patient is on heparin will trend cardiac markers check Dopplers of the lower extremity.  If patient continues to remain hemodynamically stable change to oral anticoagulation. 2. Recent Covid infection was treated.  Presently not hypoxic.   DVT prophylaxis: Heparin infusion. Code Status: Full code. Family Communication: Discussed with patient. Disposition Plan: Home. Consults called: None. Admission status: Observation.   Eduard Clos MD [facility] Pager [phone].  If 7PM-7AM, please contact night-coverage [email or web address] Password [email or web address]  08/14/2020, 11:03 PM

## 2020-08-14 NOTE — Progress Notes (Signed)
ANTICOAGULATION CONSULT NOTE - Initial Consult  Pharmacy Consult for heparin gtt Indication: pulmonary embolus  Allergies  Allergen Reactions  . Flagyl [Metronidazole] Anxiety    Patient Measurements: Height: 5\' 9"  (175.3 cm) Weight: 93 kg (205 lb) IBW/kg (Calculated) : 70.7 Heparin Dosing Weight: 89kg  Vital Signs: Temp: 98.8 F (37.1 C) (10/07 1555) BP: 141/77 (10/07 1555) Pulse Rate: 125 (10/07 1555)  Labs: Recent Labs    08/14/20 1713  HGB 15.8  HCT 45.1  PLT 217  CREATININE 0.72    Estimated Creatinine Clearance: 157.5 mL/min (by C-G formula based on SCr of 0.72 mg/dL).   Medical History: Past Medical History:  Diagnosis Date  . COVID-19   . IBS (irritable bowel syndrome)   . Palpitations      Assessment: 29 year old male presented to ED with pleuritic chest pain. Pt was admitted from 9/19-9/22 for COVID-19 pneumonia. Now found to have bilateral pulmonary artery emboli without evidence of R heart strain. Pt did not report any anticoagulants PTA.   CBC WNL today; Hgb 15.8; Plt 217.   Goal of Therapy:  Heparin level 0.3-0.7 units/ml Monitor platelets by anticoagulation protocol: Yes   Plan:  Give heparin 4500 units bolus x 1 Start heparin infusion at 1500 units/hr Check 6h HL at 0200 Monitor for s/sx bleeding, CBC, HL, and clinical progress   09-06-1987, PharmD PGY1 Pharmacy Resident 08/14/2020 7:37 PM

## 2020-08-15 ENCOUNTER — Observation Stay (HOSPITAL_BASED_OUTPATIENT_CLINIC_OR_DEPARTMENT_OTHER): Payer: Managed Care, Other (non HMO)

## 2020-08-15 DIAGNOSIS — I2699 Other pulmonary embolism without acute cor pulmonale: Secondary | ICD-10-CM

## 2020-08-15 LAB — HEPARIN LEVEL (UNFRACTIONATED)
Heparin Unfractionated: 0.49 IU/mL (ref 0.30–0.70)
Heparin Unfractionated: 0.47 IU/mL (ref 0.30–0.70)

## 2020-08-15 LAB — CBC
HCT: 41.6 % (ref 39.0–52.0)
Hemoglobin: 14.5 g/dL (ref 13.0–17.0)
MCH: 30.7 pg (ref 26.0–34.0)
MCHC: 34.9 g/dL (ref 30.0–36.0)
MCV: 87.9 fL (ref 80.0–100.0)
Platelets: 187 10*3/uL (ref 150–400)
RBC: 4.73 MIL/uL (ref 4.22–5.81)
RDW: 13.6 % (ref 11.5–15.5)
WBC: 7.9 10*3/uL (ref 4.0–10.5)
nRBC: 0 % (ref 0.0–0.2)

## 2020-08-15 LAB — TROPONIN I (HIGH SENSITIVITY): Troponin I (High Sensitivity): 3 ng/L (ref <18)

## 2020-08-15 MED ORDER — LIDOCAINE 5 % EX PTCH
1.0000 | MEDICATED_PATCH | CUTANEOUS | Status: DC
  Administered 2020-08-15: 1 via TRANSDERMAL
  Filled 2020-08-15: qty 1

## 2020-08-15 MED ORDER — APIXABAN 5 MG PO TABS
10.0000 mg | ORAL_TABLET | Freq: Two times a day (BID) | ORAL | Status: DC
  Administered 2020-08-15: 10 mg via ORAL
  Filled 2020-08-15: qty 2

## 2020-08-15 MED ORDER — LIDOCAINE 5 % EX PTCH: 1.0000 | MEDICATED_PATCH | CUTANEOUS | 0 refills | Status: DC

## 2020-08-15 MED ORDER — APIXABAN 5 MG PO TABS: 5.0000 mg | ORAL_TABLET | Freq: Two times a day (BID) | ORAL | Status: DC

## 2020-08-15 MED ORDER — APIXABAN 5 MG PO TABS
ORAL_TABLET | ORAL | 0 refills | Status: DC
Start: 2020-08-15 — End: 2022-07-10

## 2020-08-15 NOTE — Discharge Summary (Signed)
Physician Discharge Summary  SMITH POTENZA OEV:[id number] DOB: [birth year] DOA: 08/14/2020  PCP: Darrow Bussing, MD  Admit date: 08/14/2020 Discharge date: 08/15/2020  Admitted From: Home Discharge disposition: Home   Code Status: Full Code  Diet Recommendation: Regular diet  Discharge Diagnosis:   Principal Problem:   Acute pulmonary embolism without acute cor pulmonale (HCC) Active Problems:   Pulmonary embolism (HCC)  History of Present Illness / Brief narrative:  Dylan Douglas is a 29 y.o. male with recently hospitalized for COVID-19 pneumonia (9/19 -9/22).  Patient presented to the ED on 10/7 with complaint of chest pain mostly in the left upper back with shortness of breath over 24 hours.   CT angiogram of the chest showed bilateral lower lobe subsegmental pulmonary artery emboli as well as left upper lobe subsegmental branches point embolus without evidence of heart strain.   Patient was initiated on heparin drip and kept under observation.  Subjective:  Seen and examined this morning.  Young African-American male.  Walking around the room.  Not in distress.  Not on supplemental oxygen.  Hospital Course:  Acute bilateral pulmonary embolism  -Likely related to recent Covid pneumonia  -Initiated on heparin drip on admission.   -No evidence of heart strain. -Negative DVT scan of lower extremities -This morning, I switched him to Eliquis oral.  Discharge with the same.  Plan at this time is to continue it for 6 months.  Prescription for 1 month given.  He will follow up with his PCP for refills -Lidocaine patch for chest pain.  Recent Covid pneumonia  -Completely treated.  Not hypoxic.  Okay to discharge home today.  Wound care:    Discharge Exam:   Vitals:   08/14/20 2210 08/15/20 0223 08/15/20 0600 08/15/20 0907  BP: 140/82 124/81 117/70 137/74  Pulse: 81 68 76 73  Resp: 14 18 18 18   Temp: 99.1 F (37.3 C) 98.3 F (36.8 C) 98.3 F (36.8 C) 98.1 F  (36.7 C)  TempSrc: Oral Oral Oral Oral  SpO2: 98% 96% 97% 96%  Weight:      Height:        Body mass index is 30.27 kg/m.  General exam: Appears calm and comfortable.  Not in distress Skin: No rashes, lesions or ulcers. HEENT: Atraumatic, normocephalic, supple neck, no obvious bleeding Lungs: Clear to auscultation bilaterally CVS: Regular rate and rhythm, no murmur GI/Abd soft, nontender, nondistended, bowel sound present CNS: Alert, awake monitor x3 Psychiatry: Mood appropriate Extremities: No pedal edema, no calf tenderness  Follow ups:   Discharge Instructions    Diet general   Complete by: As directed    Increase activity slowly   Complete by: As directed       Follow-up Information    Koirala, Dibas, MD Follow up.   Specialty: Family Medicine Contact information: [address] [place] [phone]               Recommendations for Outpatient Follow-Up:   1. Follow-up with PCP as an outpatient  Discharge Instructions:  Follow with Primary MD Koirala, Dibas, MD in 7 days   Get CBC/BMP checked in next visit within 1 week by PCP or SNF MD ( we routinely change or add medications that can affect your baseline labs and fluid status, therefore we recommend that you get the mentioned basic workup next visit with your PCP, your PCP may decide not to get them or add new tests based on their clinical  decision)  On your next visit with your PCP, please Get Medicines reviewed and adjusted.  Please request your PCP  to go over all Hospital Tests and Procedure/Radiological results at the follow up, please get all Hospital records sent to your Prim MD by signing hospital release before you go home.  Activity: As tolerated with Full fall precautions use walker/cane & assistance as needed  For Heart failure patients - Check your Weight same time everyday, if you gain over 2 pounds, or you develop in leg swelling, experience more  shortness of breath or chest pain, call your Primary MD immediately. Follow Cardiac Low Salt Diet and 1.5 lit/day fluid restriction.  If you have smoked or chewed Tobacco in the last 2 yrs please stop smoking, stop any regular Alcohol  and or any Recreational drug use.  If you experience worsening of your admission symptoms, develop shortness of breath, life threatening emergency, suicidal or homicidal thoughts you must seek medical attention immediately by calling 911 or calling your MD immediately  if symptoms less severe.  You Must read complete instructions/literature along with all the possible adverse reactions/side effects for all the Medicines you take and that have been prescribed to you. Take any new Medicines after you have completely understood and accpet all the possible adverse reactions/side effects.   Do not drive, operate heavy machinery, perform activities at heights, swimming or participation in water activities or provide baby sitting services if your were admitted for syncope or siezures until you have seen by Primary MD or a Neurologist and advised to do so again.  Do not drive when taking Pain medications.  Do not take more than prescribed Pain, Sleep and Anxiety Medications  Wear Seat belts while driving.   Please note You were cared for by a hospitalist during your hospital stay. If you have any questions about your discharge medications or the care you received while you were in the hospital after you are discharged, you can call the unit and asked to speak with the hospitalist on call if the hospitalist that took care of you is not available. Once you are discharged, your primary care physician will handle any further medical issues. Please note that NO REFILLS for any discharge medications will be authorized once you are discharged, as it is imperative that you return to your primary care physician (or establish a relationship with a primary care physician if you do not  have one) for your aftercare needs so that they can reassess your need for medications and monitor your lab values.    Allergies as of 08/15/2020      Reactions   Flagyl [metronidazole] Anxiety      Medication List    TAKE these medications   acetaminophen 500 MG tablet Commonly known as: TYLENOL Take 500-1,000 mg by mouth every 6 (six) hours as needed for mild pain, fever or headache.   apixaban 5 MG Tabs tablet Commonly known as: ELIQUIS Take 10 mg twice daily for for 7 days followed by 5 mg daily for long-term.   lidocaine 5 % Commonly known as: LIDODERM Place 1 patch onto the skin daily. Remove & Discard patch within 12 hours or as directed by MD Start taking on: August 16, 2020   ondansetron 4 MG disintegrating tablet Commonly known as: Zofran ODT Take 1 tablet (4 mg total) by mouth every 8 (eight) hours as needed for nausea or vomiting.   ProAir HFA 108 (90 Base) MCG/ACT inhaler Generic drug: albuterol Inhale 2  puffs into the lungs See admin instructions. Inhale 2 puffs into the lungs every 4-6 hours as needed for shortness of breath or wheezing   albuterol (2.5 MG/3ML) 0.083% nebulizer solution Commonly known as: PROVENTIL Take 2.5 mg by nebulization every 6 (six) hours as needed for wheezing or shortness of breath.       Time coordinating discharge: 35 minutes  The results of significant diagnostics from this hospitalization (including imaging, microbiology, ancillary and laboratory) are listed below for reference.    Procedures and Diagnostic Studies:   DG Chest 2 View  Result Date: 08/14/2020 CLINICAL DATA:  COVID back pain EXAM: CHEST - 2 VIEW COMPARISON:  07/27/2020 FINDINGS: The heart size and mediastinal contours are within normal limits. Both lungs are clear. The visualized skeletal structures are unremarkable. Clearing of previously noted pneumonia. IMPRESSION: No active cardiopulmonary disease. Electronically Signed   By: Jasmine Pang M.D.   On:  08/14/2020 16:41   CT Angio Chest PE W and/or Wo Contrast  Result Date: 08/14/2020 CLINICAL DATA:  29 year old male with concern for pulmonary embolism. EXAM: CT ANGIOGRAPHY CHEST WITH CONTRAST TECHNIQUE: Multidetector CT imaging of the chest was performed using the standard protocol during bolus administration of intravenous contrast. Multiplanar CT image reconstructions and MIPs were obtained to evaluate the vascular anatomy. CONTRAST:  OMNIPAQUE IOHEXOL 350 MG/ML SOLN COMPARISON:  Chest radiograph dated 08/14/2020. chest CT dated 07/27/2020. FINDINGS: Evaluation of this exam is limited due to respiratory motion artifact. Cardiovascular: There is no cardiomegaly or pericardial effusion. The thoracic aorta is unremarkable. The origins of the great vessels of the aortic arch appear patent. There is mild prominence of the main pulmonary trunk suggestive of a degree of pulmonary hypertension. Evaluation of the pulmonary arteries is limited due to respiratory motion artifact and suboptimal opacification and timing of the contrast. There are however bilateral lower lobe subsegmental pulmonary artery emboli as well as left upper lobe segmental branch point embolus (125/10). No CT findings of right heart straining. Mediastinum/Nodes: No hilar or mediastinal adenopathy. The esophagus and the thyroid gland are grossly unremarkable. No mediastinal fluid collection. Lungs/Pleura: Linear and streaky subpleural density at the left lung base may represent atelectasis or scarring or possibly a small area of infarct. No focal consolidation, pleural effusion, or pneumothorax. The central airways are patent. Upper Abdomen: No acute abnormality. Musculoskeletal: No chest wall abnormality. No acute or significant osseous findings. Review of the MIP images confirms the above findings. IMPRESSION: 1. Bilateral pulmonary artery emboli. No CT findings of right heart straining. 2. Left lung base subpleural atelectasis or scarring  or possibly a small area of infarct. These results were called by telephone at the time of interpretation on 08/14/2020 at 7:11 pm to provider Jodi Geralds, who verbally acknowledged these results. Electronically Signed   By: Elgie Collard M.D.   On: 08/14/2020 19:13   VAS [place] LOWER EXTREMITY VENOUS (DVT)  Result Date: 08/15/2020  Lower Venous DVTStudy Indications: Pulmonary embolism.  Anticoagulation: Heparin. Comparison Study: No prior studies. Performing Technologist: Jean Rosenthal  Examination Guidelines: A complete evaluation includes B-mode imaging, spectral Doppler, color Doppler, and power Doppler as needed of all accessible portions of each vessel. Bilateral testing is considered an integral part of a complete examination. Limited examinations for reoccurring indications may be performed as noted. The reflux portion of the exam is performed with the patient in reverse Trendelenburg.  +---------+---------------+---------+-----------+----------+--------------+ RIGHT    CompressibilityPhasicitySpontaneityPropertiesThrombus Aging +---------+---------------+---------+-----------+----------+--------------+ CFV      Full  Yes      Yes                                 +---------+---------------+---------+-----------+----------+--------------+ SFJ      Full                                                        +---------+---------------+---------+-----------+----------+--------------+ FV Prox  Full                                                        +---------+---------------+---------+-----------+----------+--------------+ FV Mid   Full                                                        +---------+---------------+---------+-----------+----------+--------------+ FV DistalFull                                                        +---------+---------------+---------+-----------+----------+--------------+ PFV      Full                                                         +---------+---------------+---------+-----------+----------+--------------+ POP      Full           Yes      Yes                                 +---------+---------------+---------+-----------+----------+--------------+ PTV      Full                                                        +---------+---------------+---------+-----------+----------+--------------+ PERO     Full                                                        +---------+---------------+---------+-----------+----------+--------------+   +---------+---------------+---------+-----------+----------+--------------+ LEFT     CompressibilityPhasicitySpontaneityPropertiesThrombus Aging +---------+---------------+---------+-----------+----------+--------------+ CFV      Full           Yes      Yes                                 +---------+---------------+---------+-----------+----------+--------------+ SFJ  Full                                                        +---------+---------------+---------+-----------+----------+--------------+ FV Prox  Full                                                        +---------+---------------+---------+-----------+----------+--------------+ FV Mid   Full                                                        +---------+---------------+---------+-----------+----------+--------------+ FV DistalFull                                                        +---------+---------------+---------+-----------+----------+--------------+ PFV      Full                                                        +---------+---------------+---------+-----------+----------+--------------+ POP      Full           Yes      Yes                                 +---------+---------------+---------+-----------+----------+--------------+ PTV      Full                                                         +---------+---------------+---------+-----------+----------+--------------+ PERO     Full                                                        +---------+---------------+---------+-----------+----------+--------------+     Summary: RIGHT: - There is no evidence of deep vein thrombosis in the lower extremity.  - No cystic structure found in the popliteal fossa.  LEFT: - There is no evidence of deep vein thrombosis in the lower extremity.  - No cystic structure found in the popliteal fossa.  *See table(s) above for measurements and observations.    Preliminary      Labs:   Basic Metabolic Panel: Recent Labs  Lab 08/14/20 1713  NA 136  K 3.9  CL 99  CO2 28  GLUCOSE 113*  BUN 10  CREATININE 0.72  CALCIUM 9.1   GFR Estimated Creatinine Clearance: 157.5 mL/min (by C-G formula  based on SCr of 0.72 mg/dL). Liver Function Tests: No results for input(s): AST, ALT, ALKPHOS, BILITOT, PROT, ALBUMIN in the last 168 hours. No results for input(s): LIPASE, AMYLASE in the last 168 hours. No results for input(s): AMMONIA in the last 168 hours. Coagulation profile No results for input(s): INR, PROTIME in the last 168 hours.  CBC: Recent Labs  Lab 08/14/20 1713 08/15/20 0141  WBC 8.7 7.9  NEUTROABS 5.7  --   HGB 15.8 14.5  HCT 45.1 41.6  MCV 86.4 87.9  PLT 217 187   Cardiac Enzymes: No results for input(s): CKTOTAL, CKMB, CKMBINDEX, TROPONINI in the last 168 hours. BNP: Invalid input(s): POCBNP CBG: No results for input(s): GLUCAP in the last 168 hours. D-Dimer No results for input(s): DDIMER in the last 72 hours. Hgb A1c No results for input(s): HGBA1C in the last 72 hours. Lipid Profile No results for input(s): CHOL, HDL, LDLCALC, TRIG, CHOLHDL, LDLDIRECT in the last 72 hours. Thyroid function studies No results for input(s): TSH, T4TOTAL, T3FREE, THYROIDAB in the last 72 hours.  Invalid input(s): FREET3 Anemia work up No results for input(s): VITAMINB12, FOLATE,  FERRITIN, TIBC, IRON, RETICCTPCT in the last 72 hours. Microbiology No results found for this or any previous visit (from the past 240 hour(s)).   Signed: Melina Schools Joshau Code  [facility] 08/15/2020, 10:55 AM

## 2020-08-15 NOTE — Progress Notes (Signed)
D/C instructions given to patient. Patient has no questions. Once he is dressed and family is here. NT or writer can wheel patient out

## 2020-08-15 NOTE — TOC Progression Note (Signed)
Transition of Care [facility]) - Progression Note    Patient Details  Name: Dylan Douglas MRN: [id number] Date of Birth: Jan 01, 1991  [facility]) CM/SW Contact  Geni Bers, RN Phone Number: 08/15/2020, 12:00 PM  Clinical Narrative:    Spoke with pt to encourage him to make an appointment with his PCP and to request discount for Eliquis. Also explained that he will need PCP to do a prior authorization for medication. Pt was very pleasant and understood.         Expected Discharge Plan and Services           Expected Discharge Date: 08/15/20                                     Social Determinants of Health (SDOH) Interventions    Readmission Risk Interventions No flowsheet data found.

## 2020-08-15 NOTE — Progress Notes (Signed)
ANTICOAGULATION CONSULT NOTE - follow up  Pharmacy Consult for heparin gtt - transition to apixaban 10/8 Indication: pulmonary embolus  Allergies  Allergen Reactions  . Flagyl [Metronidazole] Anxiety    Patient Measurements: Height: 5\' 9"  (175.3 cm) Weight: 93 kg (205 lb) IBW/kg (Calculated) : 70.7 Heparin Dosing Weight: 89kg  Vital Signs: Temp: 98.1 F (36.7 C) (10/08 0907) Temp Source: Oral (10/08 0907) BP: 137/74 (10/08 0907) Pulse Rate: 73 (10/08 0907)  Labs: Recent Labs    08/14/20 1713 08/14/20 2315 08/15/20 0141 08/15/20 0742  HGB 15.8  --  14.5  --   HCT 45.1  --  41.6  --   PLT 217  --  187  --   HEPARINUNFRC  --   --  0.49 0.47  CREATININE 0.72  --   --   --   TROPONINIHS  --  3 3  --     Estimated Creatinine Clearance: 157.5 mL/min (by C-G formula based on SCr of 0.72 mg/dL).   Medical History: Past Medical History:  Diagnosis Date  . COVID-19   . IBS (irritable bowel syndrome)   . Palpitations      Assessment: 29 year old male presented to ED with pleuritic chest pain. Pt was admitted from 9/19-9/22 for COVID-19 pneumonia. Now found to have bilateral pulmonary artery emboli without evidence of R heart strain. Pt did not report any anticoagulants PTA.    Heparin level therapeutic this AM on current IV heparin rate of 1500 units/hr  Plan change to apixaban today and discharge patient  Goal of Therapy:  Heparin level 0.3-0.7 units/ml Monitor platelets by anticoagulation protocol: Yes   Plan:   Discontinue IV heparin now  Start apixaban 10mg  PO BID x 7 days then 5mg  PO BID thereafter  Will counsel patient prior to discharge  09-06-1985, PharmD, BCPS [phone] until 3pm 08/15/2020 10:20 AM

## 2020-08-15 NOTE — Progress Notes (Signed)
Brief pharmacy anticoagulation note:  For full details please see note from Calton Dach PharmD  Assessment and Plan: HL 0.49 therapeutic CBC WNL No bleeding noted Continue heparin drip at 1500 units/hr Check confirmatory heparin level in 6 hours Monitor for s/sx bleeding, CBC, HL, and clinical progress  Arley Phenix RPh 08/15/2020, 2:29 AM

## 2020-08-15 NOTE — Discharge Instructions (Signed)
Information on my medicine - ELIQUIS (apixaban)  This medication education was reviewed with me or my healthcare representative as part of my discharge preparation.  The pharmacist that spoke with me during my hospital stay was:  Kriste Basque, Student-PharmD  Why was Eliquis prescribed for you? Eliquis was prescribed to treat blood clots that may have been found in the veins of your legs (deep vein thrombosis) or in your lungs (pulmonary embolism) and to reduce the risk of them occurring again.  What do You need to know about Eliquis ? The starting dose is 10 mg (two 5 mg tablets) taken TWICE daily for the FIRST SEVEN (7) DAYS, then on Friday 08/22/20 the dose is reduced to ONE 5 mg tablet taken TWICE daily.  Eliquis may be taken with or without food.   Try to take the dose about the same time in the morning and in the evening. If you have difficulty swallowing the tablet whole please discuss with your pharmacist how to take the medication safely.  Take Eliquis exactly as prescribed and DO NOT stop taking Eliquis without talking to the doctor who prescribed the medication.  Stopping may increase your risk of developing a new blood clot.  Refill your prescription before you run out.  After discharge, you should have regular check-up appointments with your healthcare provider that is prescribing your Eliquis.    What do you do if you miss a dose? If a dose of ELIQUIS is not taken at the scheduled time, take it as soon as possible on the same day and twice-daily administration should be resumed. The dose should not be doubled to make up for a missed dose.  Important Safety Information A possible side effect of Eliquis is bleeding. You should call your healthcare provider right away if you experience any of the following: ? Bleeding from an injury or your nose that does not stop. ? Unusual colored urine (red or dark brown) or unusual colored stools (red or black). ? Unusual  bruising for unknown reasons. ? A serious fall or if you hit your head (even if there is no bleeding).  Some medicines may interact with Eliquis and might increase your risk of bleeding or clotting while on Eliquis. To help avoid this, consult your healthcare provider or pharmacist prior to using any new prescription or non-prescription medications, including herbals, vitamins, non-steroidal anti-inflammatory drugs (NSAIDs) and supplements.  This website has more information on Eliquis (apixaban): http://www.eliquis.com/eliquis/home

## 2020-08-15 NOTE — TOC Benefit Eligibility Note (Signed)
Transition of Care [facility] Benefit Eligibility Note    Patient Details  Name: Dylan Douglas MRN: [id number] Date of Birth: Sep 25, 1991   Medication/Dose: Eliquis 12m 2 x day  Covered?: Yes  Tier: 3 Drug  Prescription Coverage Preferred Pharmacy: local  Spoke with Person/Company/Phone Number:: Rachel/ [employer] [phone] Co-Pay: Rx requires prior auth and then copay will be given  Prior Approval: Yes  Deductible: Met  Additional Notes: prior auth ##[id number]# [id number]   FKerin SalenPhone Number: 08/15/2020, 11:54 AM

## 2020-08-15 NOTE — Progress Notes (Signed)
Lower extremity venous bilateral study completed.   Please see CV Proc for preliminary results.   Natanel Snavely, RDMS  

## 2021-01-23 ENCOUNTER — Ambulatory Visit
Admission: EM | Admit: 2021-01-23 | Discharge: 2021-01-23 | Disposition: A | Discharge status: Home / Self Care | Payer: Managed Care, Other (non HMO) | Attending: Emergency Medicine | Admitting: Emergency Medicine

## 2021-01-23 ENCOUNTER — Other Ambulatory Visit: Payer: Self-pay

## 2021-01-23 DIAGNOSIS — R110 Nausea: Secondary | ICD-10-CM | POA: Diagnosis not present

## 2021-01-23 DIAGNOSIS — R109 Unspecified abdominal pain: Secondary | ICD-10-CM

## 2021-01-23 LAB — POCT URINALYSIS DIP (MANUAL ENTRY)
Bilirubin, UA: NEGATIVE
Blood, UA: NEGATIVE
Glucose, UA: NEGATIVE mg/dL
Ketones, POC UA: NEGATIVE mg/dL
Leukocytes, UA: NEGATIVE
Nitrite, UA: NEGATIVE
Protein Ur, POC: NEGATIVE mg/dL
Spec Grav, UA: >=1.03 — AB (ref 1.010–1.025)
Urobilinogen, UA: 0.2 E.U./dL
pH, UA: 6.5 (ref 5.0–8.0)

## 2021-01-23 MED ORDER — ONDANSETRON 4 MG PO TBDP: 4.0000 mg | ORAL_TABLET | Freq: Three times a day (TID) | ORAL | 0 refills | Status: AC | PRN

## 2021-01-23 MED ORDER — HYDROXYZINE HCL 25 MG PO TABS: 25.0000 mg | ORAL_TABLET | Freq: Four times a day (QID) | ORAL | 0 refills | Status: AC

## 2021-01-23 NOTE — ED Triage Notes (Signed)
Pt present abdominal pain with shaking and nausea. Symptoms started on Tuesday of this week. Pt state he has being going to the bathroom more frequently then normal.

## 2021-01-23 NOTE — ED Provider Notes (Signed)
[name]    CSN: [id number] Arrival date & time: 01/23/21  1306      History   Chief Complaint Chief Complaint  Patient presents with  . Abdominal Pain  . Shaking    HPI Dylan Douglas is a 30 y.o. male presenting today for evaluation of abdominal pain.  Reports that he has had abdominal pain and flank discomfort beginning approximately 4 days ago.  Reports that he has had urinary odor, but denies any dysuria, hematuria or frequency.  Denies history of stones.  He reports that after COVID he developed PEs and is supposed to be on Eliquis.  He was on this for approximately 2 months, but has not been on it for the past 2 months due to cost.  He reports a similar distribution of discomfort in his left lower rib/upper abdomen into his back but a more sharp pain when he was diagnosed with PEs.  Today he describes a nauseous, pressure discomfort.  Reports eating and drinking normally without affecting or changing pain.  Bowels normal.  Denies any chest pain, but has felt short of breath.  Has had a few episodes of feeling shaky and anxious at times.  HPI  Past Medical History:  Diagnosis Date  . COVID-19   . IBS (irritable bowel syndrome)   . Palpitations     Patient Active Problem List   Diagnosis Date Noted  . Acute pulmonary embolism without acute cor pulmonale (HCC) 08/14/2020  . Pulmonary embolism (HCC) 08/14/2020  . Acute hypoxemic respiratory failure due to COVID-19 (HCC) 07/27/2020  . Sepsis (HCC) 07/27/2020  . Hypokalemia 07/27/2020  . IBS (irritable bowel syndrome) 05/27/2015  . Palpitations 07/31/2013    History reviewed. No pertinent surgical history.     Home Medications    Prior to Admission medications   Medication Sig Start Date End Date Taking? Authorizing Provider  hydrOXYzine (ATARAX/VISTARIL) 25 MG tablet Take 1 tablet (25 mg total) by mouth every 6 (six) hours. 01/23/21  Yes Sharay Bellissimo C, PA-C  ondansetron (ZOFRAN ODT) 4 MG  disintegrating tablet Take 1-2 tablets (4-8 mg total) by mouth every 8 (eight) hours as needed for nausea or vomiting. 01/23/21  Yes Seona Clemenson C, PA-C  acetaminophen (TYLENOL) 500 MG tablet Take 500-1,000 mg by mouth every 6 (six) hours as needed for mild pain, fever or headache.    [provider]  albuterol (PROAIR HFA) 108 (90 Base) MCG/ACT inhaler Inhale 2 puffs into the lungs See admin instructions. Inhale 2 puffs into the lungs every 4-6 hours as needed for shortness of breath or wheezing    [provider]  albuterol (PROVENTIL) (2.5 MG/3ML) 0.083% nebulizer solution Take 2.5 mg by nebulization every 6 (six) hours as needed for wheezing or shortness of breath.    [provider]  apixaban (ELIQUIS) 5 MG TABS tablet Take 10 mg twice daily for for 7 days followed by 5 mg daily for long-term. 08/15/20   Dahal, Melina Schools, MD  lidocaine (LIDODERM) 5 % Place 1 patch onto the skin daily. Remove & Discard patch within 12 hours or as directed by MD 08/16/20   Lorin Glass, MD  dicyclomine (BENTYL) 20 MG tablet Take 1 tablet (20 mg total) by mouth 2 (two) times daily. Patient not taking: Reported on 04/30/2018 05/19/16 07/13/19  Gerhard Munch, MD  metoCLOPramide (REGLAN) 10 MG tablet Take 1 tablet (10 mg total) by mouth 3 (three) times daily as needed for nausea (headache / nausea). Patient not taking:  Reported on 04/30/2018 02/06/16 07/13/19  Eber Hong, MD    Family History Family History  Problem Relation Age of Onset  . Breast cancer Mother   . Cancer Mother     Social History Social History   Tobacco Use  . Smoking status: Former Smoker    Types: Cigars, Cigarettes  . Smokeless tobacco: Never Used  Substance Use Topics  . Alcohol use: Yes    Alcohol/week: 0.0 standard drinks    Comment: Occasionally  . Drug use: No     Allergies   Flagyl [metronidazole]   Review of Systems Review of Systems  Constitutional: Negative for fever.  HENT: Negative for  sore throat.   Respiratory: Negative for shortness of breath.   Cardiovascular: Negative for chest pain.  Gastrointestinal: Positive for abdominal pain and nausea. Negative for vomiting.  Genitourinary: Negative for difficulty urinating, dysuria, frequency, penile discharge, penile pain, penile swelling, scrotal swelling and testicular pain.  Skin: Negative for rash.  Neurological: Negative for dizziness, light-headedness and headaches.     Physical Exam Triage Vital Signs ED Triage Vitals  Enc Vitals Group     BP 01/23/21 1318 132/81     Pulse Rate 01/23/21 1318 87     Resp 01/23/21 1318 16     Temp 01/23/21 1318 98.4 F (36.9 C)     Temp Source 01/23/21 1318 Oral     SpO2 01/23/21 1318 96 %     Weight --      Height --      Head Circumference --      Peak Flow --      Pain Score 01/23/21 1319 6     Pain Loc --      Pain Edu? --      Excl. in GC? --    No data found.  Updated Vital Signs BP 132/81 (BP Location: Right Arm)   Pulse 87   Temp 98.4 F (36.9 C) (Oral)   Resp 16   SpO2 96%   Visual Acuity Right Eye Distance:   Left Eye Distance:   Bilateral Distance:    Right Eye Near:   Left Eye Near:    Bilateral Near:     Physical Exam Vitals and nursing note reviewed.  Constitutional:      Appearance: He is well-developed.     Comments: No acute distress  HENT:     Head: Normocephalic and atraumatic.     Nose: Nose normal.  Eyes:     Conjunctiva/sclera: Conjunctivae normal.  Cardiovascular:     Rate and Rhythm: Normal rate.  Pulmonary:     Effort: Pulmonary effort is normal. No respiratory distress.     Comments: Breathing comfortably at rest, CTABL, no wheezing, rales or other adventitious sounds auscultated Abdominal:     General: There is no distension.     Comments: Mild discomfort in left upper quadrant  Musculoskeletal:        General: Normal range of motion.     Cervical back: Neck supple.     Comments: No reproducible tenderness to  palpation along left thoracic/lumbar area left flank  Skin:    General: Skin is warm and dry.  Neurological:     Mental Status: He is alert and oriented to person, place, and time.      UC Treatments / Results  Labs (all labs ordered are listed, but only abnormal results are displayed) Labs Reviewed  POCT URINALYSIS DIP (MANUAL ENTRY) - Abnormal; Notable for the following  components:      Result Value   Spec Grav, UA >=1.030 (*)    All other components within normal limits    EKG   Radiology No results found.  Procedures Procedures (including critical care time)  Medications Ordered in UC Medications - No data to display  Initial Impression / Assessment and Plan / UC Course  I have reviewed the triage vital signs and the nursing notes.  Pertinent labs & imaging results that were available during my care of the patient were reviewed by me and considered in my medical decision making (see chart for details).     Urine unremarkable.  Given recent history of PEs and has not been on anticoagulants for 2 months within his initial 6 months.,  Similar distribution from prior discomfort with PEs recommending further evaluation in emergency room.  Did discuss Xarelto may be an more football option for patient moving forward.  Patient verbalizes understanding and expresses intent to go to ED.  Discussed strict return precautions. Patient verbalized understanding and is agreeable with plan.  Final Clinical Impressions(s) / UC Diagnoses   Final diagnoses:  Left flank pain  Nausea without vomiting     Discharge Instructions     Please go to emergency room for further evaluation of left sided discomfort   Zofran for nausea as needed May trial hydroxyzine as needed for anxiety - will cause drowsiness, use at home    ED Prescriptions    Medication Sig Dispense Auth. Provider   ondansetron (ZOFRAN ODT) 4 MG disintegrating tablet Take 1-2 tablets (4-8 mg total) by mouth  every 8 (eight) hours as needed for nausea or vomiting. 20 tablet Tomislav Micale C, PA-C   hydrOXYzine (ATARAX/VISTARIL) 25 MG tablet Take 1 tablet (25 mg total) by mouth every 6 (six) hours. 12 tablet Tyree Fluharty, [place] C, PA-C     PDMP not reviewed this encounter.   Lew Dawes, PA-C 01/23/21 1451

## 2021-01-23 NOTE — Discharge Instructions (Signed)
Please go to emergency room for further evaluation of left sided discomfort   Zofran for nausea as needed May trial hydroxyzine as needed for anxiety - will cause drowsiness, use at home

## 2021-09-11 ENCOUNTER — Encounter (HOSPITAL_BASED_OUTPATIENT_CLINIC_OR_DEPARTMENT_OTHER): Payer: Self-pay | Admitting: Emergency Medicine

## 2021-09-11 ENCOUNTER — Emergency Department (HOSPITAL_BASED_OUTPATIENT_CLINIC_OR_DEPARTMENT_OTHER)
Admission: EM | Admit: 2021-09-11 | Discharge: 2021-09-11 | Disposition: A | Discharge status: Home / Self Care | Payer: Managed Care, Other (non HMO) | Attending: Emergency Medicine | Admitting: Emergency Medicine

## 2021-09-11 ENCOUNTER — Emergency Department (HOSPITAL_BASED_OUTPATIENT_CLINIC_OR_DEPARTMENT_OTHER): Payer: Managed Care, Other (non HMO)

## 2021-09-11 ENCOUNTER — Other Ambulatory Visit: Payer: Self-pay

## 2021-09-11 DIAGNOSIS — Z7901 Long term (current) use of anticoagulants: Secondary | ICD-10-CM | POA: Diagnosis not present

## 2021-09-11 DIAGNOSIS — Z20822 Contact with and (suspected) exposure to covid-19: Secondary | ICD-10-CM | POA: Diagnosis not present

## 2021-09-11 DIAGNOSIS — Z8616 Personal history of COVID-19: Secondary | ICD-10-CM | POA: Diagnosis not present

## 2021-09-11 DIAGNOSIS — Z87891 Personal history of nicotine dependence: Secondary | ICD-10-CM | POA: Diagnosis not present

## 2021-09-11 DIAGNOSIS — J180 Bronchopneumonia, unspecified organism: Secondary | ICD-10-CM | POA: Diagnosis not present

## 2021-09-11 DIAGNOSIS — J029 Acute pharyngitis, unspecified: Secondary | ICD-10-CM | POA: Diagnosis present

## 2021-09-11 LAB — CBC
HCT: 42.6 % (ref 39.0–52.0)
Hemoglobin: 15.1 g/dL (ref 13.0–17.0)
MCH: 30.4 pg (ref 26.0–34.0)
MCHC: 35.4 g/dL (ref 30.0–36.0)
MCV: 85.9 fL (ref 80.0–100.0)
Platelets: 233 10*3/uL (ref 150–400)
RBC: 4.96 MIL/uL (ref 4.22–5.81)
RDW: 13 % (ref 11.5–15.5)
WBC: 13.7 10*3/uL — ABNORMAL HIGH (ref 4.0–10.5)
nRBC: 0 % (ref 0.0–0.2)

## 2021-09-11 LAB — BASIC METABOLIC PANEL
Anion gap: 8 (ref 5–15)
BUN: 12 mg/dL (ref 6–20)
CO2: 28 mmol/L (ref 22–32)
Calcium: 9 mg/dL (ref 8.9–10.3)
Chloride: 104 mmol/L (ref 98–111)
Creatinine, Ser: 1.04 mg/dL (ref 0.61–1.24)
GFR, Estimated: >60 mL/min (ref >60)
Glucose, Bld: 89 mg/dL (ref 70–99)
Potassium: 3.5 mmol/L (ref 3.5–5.1)
Sodium: 140 mmol/L (ref 135–145)

## 2021-09-11 LAB — RESP PANEL BY RT-PCR (FLU A&B, COVID) ARPGX2
Influenza A by PCR: NEGATIVE
Influenza B by PCR: NEGATIVE
SARS Coronavirus 2 by RT PCR: NEGATIVE

## 2021-09-11 MED ORDER — DOXYCYCLINE HYCLATE 100 MG PO CAPS: 100.0000 mg | ORAL_CAPSULE | Freq: Two times a day (BID) | ORAL | 0 refills | Status: DC

## 2021-09-11 MED ORDER — IOHEXOL 350 MG/ML SOLN
75.0000 mL | Freq: Once | INTRAVENOUS | Status: AC | PRN
  Administered 2021-09-11: 75 mL via INTRAVENOUS

## 2021-09-11 MED ORDER — DOXYCYCLINE HYCLATE 100 MG PO CAPS: 100.0000 mg | ORAL_CAPSULE | Freq: Two times a day (BID) | ORAL | 0 refills | Status: AC

## 2021-09-11 NOTE — ED Triage Notes (Signed)
Pt reports generalized body aches, runny nose, sore throat for 4 days.  Pt denies fever at home. States he vomited twice 4 days ago but has not felt nauseous since.  Mild productive cough with yellow sputum.

## 2021-09-11 NOTE — ED Provider Notes (Signed)
[facility] EMERGENCY DEPT Provider Note   CSN: [id number] Arrival date & time: 09/11/21  0745     History Chief Complaint  Patient presents with   Generalized Body Aches    Runny nose, sore throat    Dylan Douglas is a 30 y.o. male.  HPI Patient presents with URI symptoms.  Body aches runny nose sore throat.  Has had around 4 days.  Has had vomiting couple days ago.  Some sputum production.  However does have some left lower chest pain.  Comes and goes.  Not necessarily associated with coughing.  Does feel like when he had previous pulmonary embolism.  No longer on anticoagulation.  Patient states both he finished up the course and that he finished 2 months early because he could not afford the medicine anymore.  However the plan was to stop after I believe 6 months.  No swelling his legs.  Does not really feel short of breath.  Has had pains on and off in the left chest since his previous pulmonary embolisms around 13 months ago.    Past Medical History:  Diagnosis Date   COVID-19    IBS (irritable bowel syndrome)    Palpitations     Patient Active Problem List   Diagnosis Date Noted   Acute pulmonary embolism without acute cor pulmonale (HCC) 08/14/2020   Pulmonary embolism (HCC) 08/14/2020   Acute hypoxemic respiratory failure due to COVID-19 (HCC) 07/27/2020   Sepsis (HCC) 07/27/2020   Hypokalemia 07/27/2020   IBS (irritable bowel syndrome) 05/27/2015   Palpitations 07/31/2013    History reviewed. No pertinent surgical history.     Family History  Problem Relation Age of Onset   Breast cancer Mother    Cancer Mother     Social History   Tobacco Use   Smoking status: Former    Types: Cigars, Cigarettes   Smokeless tobacco: Never  Substance Use Topics   Alcohol use: Yes    Alcohol/week: 0.0 standard drinks    Comment: Occasionally   Drug use: No    Home Medications Prior to Admission medications   Medication Sig Start Date End Date  Taking? Authorizing Provider  doxycycline (VIBRAMYCIN) 100 MG capsule Take 1 capsule (100 mg total) by mouth 2 (two) times daily. 09/11/21  Yes Benjiman Core, MD  acetaminophen (TYLENOL) 500 MG tablet Take 500-1,000 mg by mouth every 6 (six) hours as needed for mild pain, fever or headache.    [provider]  albuterol (PROAIR HFA) 108 (90 Base) MCG/ACT inhaler Inhale 2 puffs into the lungs See admin instructions. Inhale 2 puffs into the lungs every 4-6 hours as needed for shortness of breath or wheezing    [provider]  albuterol (PROVENTIL) (2.5 MG/3ML) 0.083% nebulizer solution Take 2.5 mg by nebulization every 6 (six) hours as needed for wheezing or shortness of breath.    [provider]  apixaban (ELIQUIS) 5 MG TABS tablet Take 10 mg twice daily for for 7 days followed by 5 mg daily for long-term. 08/15/20   Lorin Glass, MD  hydrOXYzine (ATARAX/VISTARIL) 25 MG tablet Take 1 tablet (25 mg total) by mouth every 6 (six) hours. 01/23/21   Wieters, Hallie C, PA-C  lidocaine (LIDODERM) 5 % Place 1 patch onto the skin daily. Remove & Discard patch within 12 hours or as directed by MD 08/16/20   Lorin Glass, MD  ondansetron (ZOFRAN ODT) 4 MG disintegrating tablet Take 1-2 tablets (4-8 mg total) by mouth every 8 (  eight) hours as needed for nausea or vomiting. 01/23/21   Wieters, Hallie C, PA-C  dicyclomine (BENTYL) 20 MG tablet Take 1 tablet (20 mg total) by mouth 2 (two) times daily. Patient not taking: Reported on 04/30/2018 05/19/16 07/13/19  Gerhard Munch, MD  metoCLOPramide (REGLAN) 10 MG tablet Take 1 tablet (10 mg total) by mouth 3 (three) times daily as needed for nausea (headache / nausea). Patient not taking: Reported on 04/30/2018 02/06/16 07/13/19  Eber Hong, MD    Allergies    Flagyl [metronidazole]  Review of Systems   Review of Systems  Constitutional:  Negative for appetite change and fatigue.  HENT:  Positive for congestion.   Respiratory:   Positive for cough.   Cardiovascular:  Positive for chest pain. Negative for leg swelling.  Gastrointestinal:  Positive for vomiting.  Genitourinary:  Negative for flank pain.  Skin:  Negative for rash.  Neurological:  Negative for weakness.  Psychiatric/Behavioral:  Negative for confusion.    Physical Exam Updated Vital Signs BP 121/69 (BP Location: Right Arm)   Pulse 84   Temp 98.5 F (36.9 C) (Oral)   Resp 16   SpO2 97%   Physical Exam Constitutional:      Appearance: Normal appearance.  HENT:     Head: Normocephalic.     Mouth/Throat:     Pharynx: Posterior oropharyngeal erythema present. No oropharyngeal exudate.  Cardiovascular:     Rate and Rhythm: Regular rhythm.  Pulmonary:     Comments: Mild anterior left lower chest  tenderness. No rash Chest:     Chest wall: Tenderness present.  Abdominal:     Tenderness: There is no abdominal tenderness.  Musculoskeletal:        General: No tenderness.     Cervical back: Neck supple.  Skin:    General: Skin is warm.     Capillary Refill: Capillary refill takes less than 2 seconds.  Neurological:     General: No focal deficit present.     Mental Status: He is alert.    ED Results / Procedures / Treatments   Labs (all labs ordered are listed, but only abnormal results are displayed) Labs Reviewed  CBC - Abnormal; Notable for the following components:      Result Value   WBC 13.7 (*)    All other components within normal limits  RESP PANEL BY RT-PCR (FLU A&B, COVID) ARPGX2  BASIC METABOLIC PANEL    EKG None  Radiology CT Angio Chest PE W and/or Wo Contrast  Result Date: 09/11/2021 CLINICAL DATA:  PE suspected, high prob. Previous history of pulmonary embolism. Rhinorrhea. Generalized body aches. Sore throat. Mild productive cough. EXAM: CT ANGIOGRAPHY CHEST WITH CONTRAST TECHNIQUE: Multidetector CT imaging of the chest was performed using the standard protocol during bolus administration of intravenous contrast.  Multiplanar CT image reconstructions and MIPs were obtained to evaluate the vascular anatomy. CONTRAST:  96mL OMNIPAQUE IOHEXOL 350 MG/ML SOLN COMPARISON:  08/14/2020 chest CT angiogram. FINDINGS: Cardiovascular: The study is high quality for the evaluation of pulmonary embolism. There are no convincing filling defects in the central, lobar, segmental or subsegmental pulmonary artery branches to suggest acute pulmonary embolism. Previously visualized acute bilateral pulmonary emboli have resolved. Great vessels are normal in course and caliber. Normal heart size. No significant pericardial fluid/thickening. Mediastinum/Nodes: No discrete thyroid nodules. Unremarkable esophagus. No pathologically enlarged axillary, mediastinal or hilar lymph nodes. Wedge-shaped mixed soft tissue and stable fat density in the anterior mediastinum (series 4/image 40), compatible with mild  thymic hyperplasia, similar. Lungs/Pleura: No pneumothorax. No pleural effusion. Mild patchy peribronchovascular ground-glass opacity and minimal patchy consolidation in the posterior right upper lobe and posteromedial left lower lobe, new from prior chest CT. No lung masses or significant pulmonary nodules. Upper abdomen: No acute abnormality. Musculoskeletal:  No aggressive appearing focal osseous lesions. Review of the MIP images confirms the above findings. IMPRESSION: 1. No acute pulmonary embolism. 2. Mild patchy peribronchovascular ground-glass opacity and minimal patchy consolidation in the posterior right upper lobe and posteromedial left lower lobe, new from prior chest CT, most compatible with mild multilobar bronchopneumonia. Electronically Signed   By: Delbert Phenix M.D.   On: 09/11/2021 10:06    Procedures Procedures   Medications Ordered in ED Medications  iohexol (OMNIPAQUE) 350 MG/ML injection 75 mL (75 mLs Intravenous Contrast Given 09/11/21 0945)    ED Course  I have reviewed the triage vital signs and the nursing  notes.  Pertinent labs & imaging results that were available during my care of the patient were reviewed by me and considered in my medical decision making (see chart for details).    MDM Rules/Calculators/A&P                           Patient with URI symptoms cough.  Also some left lower chest pain.  Did have previous pulmonary embolisms with COVID.  Had been off his anticoagulation.  Has had some pain on and off in this area since.  Lab work overall reassuring but does have mild elevated white count.  COVID and flu negative.  CT scan done due to recent pulmonary embolisms.  It showed no clots, however did show multifocal pneumonia.  Well-appearing.  Not hypoxic.  Will treat with doxycycline.  Appears stable for discharge.  Follow-up with PCP as needed. Final Clinical Impression(s) / ED Diagnoses Final diagnoses:  Bronchopneumonia    Rx / DC Orders ED Discharge Orders          Ordered    doxycycline (VIBRAMYCIN) 100 MG capsule  2 times daily        09/11/21 1124             Benjiman Core, MD 09/11/21 1130

## 2022-06-30 ENCOUNTER — Encounter (HOSPITAL_BASED_OUTPATIENT_CLINIC_OR_DEPARTMENT_OTHER): Payer: Self-pay

## 2022-06-30 ENCOUNTER — Other Ambulatory Visit: Payer: Self-pay

## 2022-06-30 ENCOUNTER — Emergency Department (HOSPITAL_BASED_OUTPATIENT_CLINIC_OR_DEPARTMENT_OTHER)
Admission: EM | Admit: 2022-06-30 | Discharge: 2022-06-30 | Disposition: A | Discharge status: Home / Self Care | Payer: Managed Care, Other (non HMO) | Attending: Emergency Medicine | Admitting: Emergency Medicine

## 2022-06-30 DIAGNOSIS — Z7901 Long term (current) use of anticoagulants: Secondary | ICD-10-CM | POA: Diagnosis not present

## 2022-06-30 DIAGNOSIS — R109 Unspecified abdominal pain: Secondary | ICD-10-CM | POA: Insufficient documentation

## 2022-06-30 DIAGNOSIS — B349 Viral infection, unspecified: Secondary | ICD-10-CM | POA: Diagnosis not present

## 2022-06-30 DIAGNOSIS — R519 Headache, unspecified: Secondary | ICD-10-CM | POA: Diagnosis present

## 2022-06-30 DIAGNOSIS — Z20822 Contact with and (suspected) exposure to covid-19: Secondary | ICD-10-CM | POA: Insufficient documentation

## 2022-06-30 LAB — RESP PANEL BY RT-PCR (FLU A&B, COVID) ARPGX2
Influenza A by PCR: NEGATIVE
Influenza B by PCR: NEGATIVE
SARS Coronavirus 2 by RT PCR: NEGATIVE

## 2022-06-30 NOTE — ED Provider Notes (Signed)
  [facility] EMERGENCY DEPT Provider Note   CSN: [id number] Arrival date & time: 06/30/22  1340     History {Add pertinent medical, surgical, social history, OB history to HPI:1} Chief Complaint  Patient presents with  . COVID-19 Exposure    Dylan Douglas. is a 31 y.o. male.  HPI       Home Medications Prior to Admission medications   Not on File      Allergies    Flagyl [metronidazole]    Review of Systems   Review of Systems  Physical Exam Updated Vital Signs BP (!) 131/101 (BP Location: Right Arm)   Pulse 75   Temp 98.4 F (36.9 C)   Resp 20   Ht 5\' 10"  (1.778 m)   Wt 95.3 kg   SpO2 98%   BMI 30.13 kg/m  Physical Exam  ED Results / Procedures / Treatments   Labs (all labs ordered are listed, but only abnormal results are displayed) Labs Reviewed  RESP PANEL BY RT-PCR (FLU A&B, COVID) ARPGX2    EKG None  Radiology No results found.  Procedures Procedures  {Document cardiac monitor, telemetry assessment procedure when appropriate:1}  Medications Ordered in ED Medications - No data to display  ED Course/ Medical Decision Making/ A&P                           Medical Decision Making  ***  {Document critical care time when appropriate:1} {Document review of labs and clinical decision tools ie heart score, Chads2Vasc2 etc:1}  {Document your independent review of radiology images, and any outside records:1} {Document your discussion with family members, caretakers, and with consultants:1} {Document social determinants of health affecting pt's care:1} {Document your decision making why or why not admission, treatments were needed:1} Final Clinical Impression(s) / ED Diagnoses Final diagnoses:  None    Rx / DC Orders ED Discharge Orders     None

## 2022-06-30 NOTE — ED Triage Notes (Signed)
Patient here POV from Home.  Endorses Recent Exposure to COVID-19 Positive Individual approximately 3-4 days ago. Endorses Symptoms over the Past 2 days including Fatigue, Chills, and Headaches.   No Known Fevers. No Cough. Some Congestion. Also endorses Left Sided ABD Discomfort Intermittently for a few months. No Pain currently.   NAD Noted during Triage. A&Ox4. GCS 15. Ambulatory.

## 2022-06-30 NOTE — Discharge Instructions (Signed)
Your covid test was negative.   Viral Illness TREATMENT  Treatment is directed at relieving symptoms. There is no cure. Antibiotics are not effective, because the infection is caused by a virus, not by bacteria. Treatment may include:  Increased fluid intake. Sports drinks offer valuable electrolytes, sugars, and fluids.  Breathing heated mist or steam (vaporizer or shower).  Eating chicken soup or other clear broths, and maintaining good nutrition.  Getting plenty of rest.  Using gargles or lozenges for comfort.  Increasing usage of your inhaler if you have asthma.  Return to work when your temperature has returned to normal.  Gargle warm salt water and spit it out for sore throat. Take benadryl to decrease sinus secretions. Continue to alternate between Tylenol and ibuprofen for pain and fever control.  Follow Up: Follow up with your primary care doctor in 5-7 days for recheck of ongoing symptoms.  Return to emergency department for emergent changing or worsening of symptoms.

## 2022-07-01 ENCOUNTER — Encounter (HOSPITAL_BASED_OUTPATIENT_CLINIC_OR_DEPARTMENT_OTHER): Payer: Self-pay | Admitting: Emergency Medicine

## 2022-07-09 ENCOUNTER — Encounter (HOSPITAL_BASED_OUTPATIENT_CLINIC_OR_DEPARTMENT_OTHER): Payer: Self-pay

## 2022-07-09 ENCOUNTER — Other Ambulatory Visit: Payer: Self-pay

## 2022-07-09 DIAGNOSIS — U071 COVID-19: Secondary | ICD-10-CM | POA: Insufficient documentation

## 2022-07-09 DIAGNOSIS — R519 Headache, unspecified: Secondary | ICD-10-CM | POA: Diagnosis present

## 2022-07-09 DIAGNOSIS — Z8616 Personal history of COVID-19: Secondary | ICD-10-CM | POA: Diagnosis not present

## 2022-07-09 LAB — SARS CORONAVIRUS 2 BY RT PCR: SARS Coronavirus 2 by RT PCR: POSITIVE — AB

## 2022-07-09 NOTE — ED Triage Notes (Addendum)
Patient here POV from Home.  Endorses Headache, Aches, Fatigue, Cough, Congestion, Nasal Drainage for approximately 2 Days. Consistent Symptoms. 3 COVID-19 Tests at Home Positive.   Fever Initially on the First Day.   NAD Noted during Triage. A&Ox4. GCS 15. Ambulatory.

## 2022-07-10 ENCOUNTER — Emergency Department (HOSPITAL_BASED_OUTPATIENT_CLINIC_OR_DEPARTMENT_OTHER)
Admission: EM | Admit: 2022-07-10 | Discharge: 2022-07-10 | Disposition: A | Discharge status: Home / Self Care | Payer: Managed Care, Other (non HMO) | Attending: Emergency Medicine | Admitting: Emergency Medicine

## 2022-07-10 DIAGNOSIS — U071 COVID-19: Secondary | ICD-10-CM

## 2022-07-10 MED ORDER — NIRMATRELVIR/RITONAVIR (PAXLOVID)TABLET: 3.0000 | ORAL_TABLET | Freq: Two times a day (BID) | ORAL | 0 refills | Status: AC

## 2022-07-10 NOTE — ED Provider Notes (Signed)
[facility] EMERGENCY DEPT Provider Note  CSN: [id number] Arrival date & time: 07/09/22 2239  Chief Complaint(s) Headache  HPI Dylan Douglas. is a 31 y.o. male with a past medical history listed below including pulmonary embolisms related to COVID-19 infection who completed his anticoagulation course.  He presents today for 2 days of fevers, headache, myalgias, cough, congestion, generalized fatigue.  He reports testing positive with 3 at home test.  He has been controlling his symptoms with over-the-counter Tylenol.  Presented tonight just due to the severity of his symptoms which improved while taking a nap here in the emergency department.  He is denying any vomiting or diarrhea.  No chest pain or shortness of breath.  No other physical complaints.  The history is provided by the patient.    Past Medical History Past Medical History:  Diagnosis Date   COVID-19    IBS (irritable bowel syndrome)    Palpitations    Patient Active Problem List   Diagnosis Date Noted   Acute pulmonary embolism without acute cor pulmonale (HCC) 08/14/2020   Pulmonary embolism (HCC) 08/14/2020   Acute hypoxemic respiratory failure due to COVID-19 (HCC) 07/27/2020   Sepsis (HCC) 07/27/2020   Hypokalemia 07/27/2020   IBS (irritable bowel syndrome) 05/27/2015   Palpitations 07/31/2013   Home Medication(s) Prior to Admission medications   Medication Sig Start Date End Date Taking? Authorizing Provider  nirmatrelvir/ritonavir EUA (PAXLOVID) 20 x 150 MG & 10 x 100MG  TABS Take 3 tablets by mouth 2 (two) times daily for 5 days. Patient GFR is >60. Take nirmatrelvir (150 mg) two tablets twice daily for 5 days and ritonavir (100 mg) one tablet twice daily for 5 days. 07/10/22 07/15/22 Yes Weston Kallman, 09/14/22, MD  acetaminophen (TYLENOL) 500 MG tablet Take 500-1,000 mg by mouth every 6 (six) hours as needed for mild pain, fever or headache.    [provider]  albuterol (PROAIR HFA) 108  (90 Base) MCG/ACT inhaler Inhale 2 puffs into the lungs See admin instructions. Inhale 2 puffs into the lungs every 4-6 hours as needed for shortness of breath or wheezing    [provider]  albuterol (PROVENTIL) (2.5 MG/3ML) 0.083% nebulizer solution Take 2.5 mg by nebulization every 6 (six) hours as needed for wheezing or shortness of breath.    [provider]  doxycycline (VIBRAMYCIN) 100 MG capsule Take 1 capsule (100 mg total) by mouth 2 (two) times daily. 09/11/21   13/4/22, MD  hydrOXYzine (ATARAX/VISTARIL) 25 MG tablet Take 1 tablet (25 mg total) by mouth every 6 (six) hours. 01/23/21   Wieters, Hallie C, PA-C  lidocaine (LIDODERM) 5 % Place 1 patch onto the skin daily. Remove & Discard patch within 12 hours or as directed by MD 08/16/20   10/16/20, MD  ondansetron (ZOFRAN ODT) 4 MG disintegrating tablet Take 1-2 tablets (4-8 mg total) by mouth every 8 (eight) hours as needed for nausea or vomiting. 01/23/21   Wieters, Hallie C, PA-C  dicyclomine (BENTYL) 20 MG tablet Take 1 tablet (20 mg total) by mouth 2 (two) times daily. Patient not taking: Reported on 04/30/2018 05/19/16 07/13/19  09/12/19, MD  metoCLOPramide (REGLAN) 10 MG tablet Take 1 tablet (10 mg total) by mouth 3 (three) times daily as needed for nausea (headache / nausea). Patient not taking: Reported on 04/30/2018 02/06/16 07/13/19  09/12/19, MD  Allergies Flagyl [metronidazole] and Flagyl [metronidazole]  Review of Systems Review of Systems As noted in HPI  Physical Exam Vital Signs  I have reviewed the triage vital signs BP (!) 152/87 (BP Location: Right Arm)   Pulse 81   Temp 98.3 F (36.8 C) (Oral)   Resp 18   Ht 5\' 10"  (1.778 m)   Wt 95.3 kg   SpO2 100%   BMI 30.15 kg/m   Physical Exam Vitals reviewed.  Constitutional:      General: He is not  in acute distress.    Appearance: He is well-developed. He is not diaphoretic.  HENT:     Head: Normocephalic and atraumatic.     Nose: Nose normal.  Eyes:     General: No scleral icterus.       Right eye: No discharge.        Left eye: No discharge.     Conjunctiva/sclera: Conjunctivae normal.     Pupils: Pupils are equal, round, and reactive to light.  Cardiovascular:     Rate and Rhythm: Normal rate and regular rhythm.     Heart sounds: No murmur heard.    No friction rub. No gallop.  Pulmonary:     Effort: Pulmonary effort is normal. No respiratory distress.     Breath sounds: Normal breath sounds. No stridor. No rales.  Abdominal:     General: There is no distension.     Palpations: Abdomen is soft.     Tenderness: There is no abdominal tenderness.  Musculoskeletal:        General: No tenderness.     Cervical back: Normal range of motion and neck supple.  Skin:    General: Skin is warm and dry.     Findings: No erythema or rash.  Neurological:     Mental Status: He is alert and oriented to person, place, and time.     ED Results and Treatments Labs (all labs ordered are listed, but only abnormal results are displayed) Labs Reviewed  SARS CORONAVIRUS 2 BY RT PCR - Abnormal; Notable for the following components:      Result Value   SARS Coronavirus 2 by RT PCR POSITIVE (*)    All other components within normal limits                                                                                                                         EKG  EKG Interpretation  Date/Time:    Ventricular Rate:    PR Interval:    QRS Duration:   QT Interval:    QTC Calculation:   R Axis:     Text Interpretation:         Radiology No results found.  Medications Ordered in ED Medications - No data to display  Procedures Procedures  (including  critical care time)  Medical Decision Making / ED Course   Medical Decision Making Amount and/or Complexity of Data Reviewed Labs: ordered. Decision-making details documented in ED Course.    Patient presents with viral symptoms for 2 days.  Adequate oral hydration. Rest of history as above.  Patient appears well. No signs of toxicity, patient is interactive. No hypoxia, tachypnea or other signs of respiratory distress. No sign of clinical dehydration. Lung exam clear. Rest of exam as above.  Most consistent with viral illness. COVID positive.  Chest x-ray not indicated at this time.  Given patient's prior history of COVID-related PEs, will prescribe Paxlovid.    Discussed additional symptomatic treatment with the patient and they will follow closely with their PCP.        Final Clinical Impression(s) / ED Diagnoses Final diagnoses:  COVID-19 virus infection   The patient appears reasonably screened and/or stabilized for discharge and I doubt any other medical condition or other [facility] requiring further screening, evaluation, or treatment in the ED at this time. I have discussed the findings, Dx and Tx plan with the patient/family who expressed understanding and agree(s) with the plan. Discharge instructions discussed at length. The patient/family was given strict return precautions who verbalized understanding of the instructions. No further questions at time of discharge.  Disposition: Discharge  Condition: Good  ED Discharge Orders          Ordered    nirmatrelvir/ritonavir EUA (PAXLOVID) 20 x 150 MG & 10 x 100MG  TABS  2 times daily        07/10/22 09/09/22            Follow Up: 8099, MD [address] [place] [phone]  Call  to schedule an appointment for close follow up           This chart was dictated using voice recognition software.  Despite best efforts to proofread,  errors can occur which can change  the documentation meaning.    [phone], MD 07/10/22 [phone]

## 2022-07-10 NOTE — ED Notes (Signed)
Pt agreeable with d/c plan as discussed by provider- this nurse has verbally reinforced d/c instructions and provided pt with written copy - pt acknowledges verbal understanding and denies any additional questions concerns needs- ambulatory independently at d/c; gait steady; no distress

## 2022-07-10 NOTE — Discharge Instructions (Addendum)
You may take over-the-counter medicine for symptomatic relief, such as Tylenol, Motrin, TheraFlu, Alka seltzer , black elderberry, etc. Please limit acetaminophen (Tylenol) to 4000 mg and Ibuprofen (Motrin, Advil, etc.) to 2400 mg for a 24hr period. Please note that other over-the-counter medicine may contain acetaminophen or ibuprofen as a component of their ingredients.   

## 2022-09-09 ENCOUNTER — Encounter (HOSPITAL_BASED_OUTPATIENT_CLINIC_OR_DEPARTMENT_OTHER): Payer: Self-pay

## 2022-09-09 ENCOUNTER — Other Ambulatory Visit: Payer: Self-pay

## 2022-09-09 ENCOUNTER — Emergency Department (HOSPITAL_BASED_OUTPATIENT_CLINIC_OR_DEPARTMENT_OTHER): Payer: Managed Care, Other (non HMO)

## 2022-09-09 ENCOUNTER — Emergency Department (HOSPITAL_BASED_OUTPATIENT_CLINIC_OR_DEPARTMENT_OTHER)
Admission: EM | Admit: 2022-09-09 | Discharge: 2022-09-09 | Disposition: A | Discharge status: Home / Self Care | Payer: Managed Care, Other (non HMO) | Attending: Emergency Medicine | Admitting: Emergency Medicine

## 2022-09-09 DIAGNOSIS — R109 Unspecified abdominal pain: Secondary | ICD-10-CM | POA: Insufficient documentation

## 2022-09-09 DIAGNOSIS — R6883 Chills (without fever): Secondary | ICD-10-CM | POA: Insufficient documentation

## 2022-09-09 DIAGNOSIS — R112 Nausea with vomiting, unspecified: Secondary | ICD-10-CM | POA: Diagnosis present

## 2022-09-09 DIAGNOSIS — R197 Diarrhea, unspecified: Secondary | ICD-10-CM | POA: Insufficient documentation

## 2022-09-09 DIAGNOSIS — Z20822 Contact with and (suspected) exposure to covid-19: Secondary | ICD-10-CM | POA: Diagnosis not present

## 2022-09-09 LAB — CBC
HCT: 48 % (ref 39.0–52.0)
Hemoglobin: 17.5 g/dL — ABNORMAL HIGH (ref 13.0–17.0)
MCH: 31 pg (ref 26.0–34.0)
MCHC: 36.5 g/dL — ABNORMAL HIGH (ref 30.0–36.0)
MCV: 85.1 fL (ref 80.0–100.0)
Platelets: 265 10*3/uL (ref 150–400)
RBC: 5.64 MIL/uL (ref 4.22–5.81)
RDW: 13 % (ref 11.5–15.5)
WBC: 13.9 10*3/uL — ABNORMAL HIGH (ref 4.0–10.5)
nRBC: 0 % (ref 0.0–0.2)

## 2022-09-09 LAB — COMPREHENSIVE METABOLIC PANEL
ALT: 21 U/L (ref 0–44)
AST: 19 U/L (ref 15–41)
Albumin: 5.5 g/dL — ABNORMAL HIGH (ref 3.5–5.0)
Alkaline Phosphatase: 45 U/L (ref 38–126)
Anion gap: 14 (ref 5–15)
BUN: 11 mg/dL (ref 6–20)
CO2: 26 mmol/L (ref 22–32)
Calcium: 10.3 mg/dL (ref 8.9–10.3)
Chloride: 102 mmol/L (ref 98–111)
Creatinine, Ser: 1.08 mg/dL (ref 0.61–1.24)
GFR, Estimated: >60 mL/min (ref >60)
Glucose, Bld: 103 mg/dL — ABNORMAL HIGH (ref 70–99)
Potassium: 3.8 mmol/L (ref 3.5–5.1)
Sodium: 142 mmol/L (ref 135–145)
Total Bilirubin: 1.1 mg/dL (ref 0.3–1.2)
Total Protein: 8.3 g/dL — ABNORMAL HIGH (ref 6.5–8.1)

## 2022-09-09 LAB — URINALYSIS, ROUTINE W REFLEX MICROSCOPIC
Bilirubin Urine: NEGATIVE
Glucose, UA: NEGATIVE mg/dL
Hgb urine dipstick: NEGATIVE
Leukocytes,Ua: NEGATIVE
Nitrite: NEGATIVE
Protein, ur: 30 mg/dL — AB
Specific Gravity, Urine: 1.03 (ref 1.005–1.030)
pH: 7 (ref 5.0–8.0)

## 2022-09-09 LAB — RESP PANEL BY RT-PCR (FLU A&B, COVID) ARPGX2
Influenza A by PCR: NEGATIVE
Influenza B by PCR: NEGATIVE
SARS Coronavirus 2 by RT PCR: NEGATIVE

## 2022-09-09 LAB — LIPASE, BLOOD: Lipase: 24 U/L (ref 11–51)

## 2022-09-09 MED ORDER — SODIUM CHLORIDE 0.9 % IV BOLUS
1000.0000 mL | Freq: Once | INTRAVENOUS | Status: AC
  Administered 2022-09-09: 1000 mL via INTRAVENOUS

## 2022-09-09 MED ORDER — ONDANSETRON HCL 4 MG/2ML IJ SOLN
4.0000 mg | Freq: Once | INTRAMUSCULAR | Status: AC
  Administered 2022-09-09: 4 mg via INTRAVENOUS
  Filled 2022-09-09: qty 2

## 2022-09-09 MED ORDER — ONDANSETRON HCL 4 MG PO TABS: 4.0000 mg | ORAL_TABLET | Freq: Four times a day (QID) | ORAL | 0 refills | Status: AC | PRN

## 2022-09-09 MED ORDER — IOHEXOL 300 MG/ML  SOLN
100.0000 mL | Freq: Once | INTRAMUSCULAR | Status: AC | PRN
  Administered 2022-09-09: 85 mL via INTRAVENOUS

## 2022-09-09 NOTE — Discharge Instructions (Signed)
Return to ED with any new or worsening symptoms Follow-up with your PCP for further management Continue pushing fluids at home to include Body Armor, Pedialyte Please take Zofran as needed every 6 hours for nausea and vomiting

## 2022-09-09 NOTE — ED Notes (Addendum)
Discharge instructions discussed with pt. Pt verbalized understanding. Pt stable and ambulatory.  °

## 2022-09-09 NOTE — ED Triage Notes (Signed)
Patient here POV from Home.  Endorses Headache, N/V/D, Chills that began this AM.    No Known Fevers. No Cough.   NAD Noted during Triage. A&Ox4. GCS 15.

## 2022-09-09 NOTE — ED Provider Notes (Signed)
[facility] EMERGENCY DEPT Provider Note   CSN: [id number] Arrival date & time: 09/09/22  1145     History  Chief Complaint  Patient presents with   Emesis    Dylan Douglas. is a 31 y.o. male with medical history of IBS, palpitations.  Patient presents to ED for evaluation of nausea, vomiting and diarrhea.  Patient reports that he woke up this morning and started having episodes of nausea and vomiting along with diarrhea.  Patient denies any blood in stool or vomit.  Patient states that last night he went out to a bar with friends due to the fact it was his birthday and he consumed a large amount of alcoholic beverages at this time.  Patient denies any abnormal food intake.  Patient denies any known sick contacts.  Patient denies any fevers, abdominal pain.   Emesis Associated symptoms: abdominal pain and chills   Associated symptoms: no diarrhea and no fever        Home Medications Prior to Admission medications   Medication Sig Start Date End Date Taking? Authorizing Provider  ondansetron (ZOFRAN) 4 MG tablet Take 1 tablet (4 mg total) by mouth every 6 (six) hours as needed for nausea or vomiting. 09/09/22  Yes Al Decant, PA-C  acetaminophen (TYLENOL) 500 MG tablet Take 500-1,000 mg by mouth every 6 (six) hours as needed for mild pain, fever or headache.    [provider]  albuterol (PROAIR HFA) 108 (90 Base) MCG/ACT inhaler Inhale 2 puffs into the lungs See admin instructions. Inhale 2 puffs into the lungs every 4-6 hours as needed for shortness of breath or wheezing    [provider]  albuterol (PROVENTIL) (2.5 MG/3ML) 0.083% nebulizer solution Take 2.5 mg by nebulization every 6 (six) hours as needed for wheezing or shortness of breath.    [provider]  doxycycline (VIBRAMYCIN) 100 MG capsule Take 1 capsule (100 mg total) by mouth 2 (two) times daily. 09/11/21   Benjiman Core, MD  hydrOXYzine (ATARAX/VISTARIL) 25 MG  tablet Take 1 tablet (25 mg total) by mouth every 6 (six) hours. 01/23/21   Wieters, Hallie C, PA-C  lidocaine (LIDODERM) 5 % Place 1 patch onto the skin daily. Remove & Discard patch within 12 hours or as directed by MD 08/16/20   Lorin Glass, MD  ondansetron (ZOFRAN ODT) 4 MG disintegrating tablet Take 1-2 tablets (4-8 mg total) by mouth every 8 (eight) hours as needed for nausea or vomiting. 01/23/21   Wieters, Hallie C, PA-C  dicyclomine (BENTYL) 20 MG tablet Take 1 tablet (20 mg total) by mouth 2 (two) times daily. Patient not taking: Reported on 04/30/2018 05/19/16 07/13/19  Gerhard Munch, MD  metoCLOPramide (REGLAN) 10 MG tablet Take 1 tablet (10 mg total) by mouth 3 (three) times daily as needed for nausea (headache / nausea). Patient not taking: Reported on 04/30/2018 02/06/16 07/13/19  Eber Hong, MD      Allergies    Flagyl [metronidazole] and Flagyl [metronidazole]    Review of Systems   Review of Systems  Constitutional:  Positive for chills. Negative for fever.  Gastrointestinal:  Positive for abdominal pain, nausea and vomiting. Negative for diarrhea.  Genitourinary:  Negative for dysuria.  All other systems reviewed and are negative.   Physical Exam Updated Vital Signs BP 126/76 (BP Location: Right Arm)   Pulse 65   Temp 98.2 F (36.8 C) (Oral)   Resp 17   Ht 5\' 10"  (1.778 m)   Wt  95.3 kg   SpO2 99%   BMI 30.15 kg/m  Physical Exam Vitals and nursing note reviewed.  Constitutional:      General: He is not in acute distress.    Appearance: He is well-developed.  HENT:     Head: Normocephalic and atraumatic.  Eyes:     Conjunctiva/sclera: Conjunctivae normal.  Cardiovascular:     Rate and Rhythm: Normal rate and regular rhythm.     Heart sounds: No murmur heard. Pulmonary:     Effort: Pulmonary effort is normal. No respiratory distress.     Breath sounds: Normal breath sounds.  Abdominal:     Palpations: Abdomen is soft.     Tenderness: There is abdominal  tenderness.     Comments: Nonlocalized tenderness to abdomen. Belly soft and compressible.  Musculoskeletal:        General: No swelling.     Cervical back: Neck supple.  Skin:    General: Skin is warm and dry.     Capillary Refill: Capillary refill takes less than 2 seconds.  Neurological:     Mental Status: He is alert.  Psychiatric:        Mood and Affect: Mood normal.     ED Results / Procedures / Treatments   Labs (all labs ordered are listed, but only abnormal results are displayed) Labs Reviewed  COMPREHENSIVE METABOLIC PANEL - Abnormal; Notable for the following components:      Result Value   Glucose, Bld 103 (*)    Total Protein 8.3 (*)    Albumin 5.5 (*)    All other components within normal limits  CBC - Abnormal; Notable for the following components:   WBC 13.9 (*)    Hemoglobin 17.5 (*)    MCHC 36.5 (*)    All other components within normal limits  URINALYSIS, ROUTINE W REFLEX MICROSCOPIC - Abnormal; Notable for the following components:   Ketones, ur TRACE (*)    Protein, ur 30 (*)    All other components within normal limits  RESP PANEL BY RT-PCR (FLU A&B, COVID) ARPGX2  LIPASE, BLOOD    EKG None  Radiology CT ABDOMEN PELVIS W CONTRAST  Result Date: 09/09/2022 CLINICAL DATA:  Abdominal pain, vomiting EXAM: CT ABDOMEN AND PELVIS WITH CONTRAST TECHNIQUE: Multidetector CT imaging of the abdomen and pelvis was performed using the standard protocol following bolus administration of intravenous contrast. RADIATION DOSE REDUCTION: This exam was performed according to the departmental dose-optimization program which includes automated exposure control, adjustment of the mA and/or kV according to patient size and/or use of iterative reconstruction technique. CONTRAST:  36mL OMNIPAQUE IOHEXOL 300 MG/ML  SOLN COMPARISON:  04/30/2018 FINDINGS: Lower chest: Unremarkable. Hepatobiliary: No focal abnormalities are seen in liver. There is no dilation of bile ducts.  Gallbladder is unremarkable. Pancreas: Unremarkable. Spleen: Unremarkable. Adrenals/Urinary Tract: Adrenals are not enlarged. There is no hydronephrosis. There are no renal or ureteral stones. Stomach/Bowel: Stomach is unremarkable. Small bowel loops are not dilated. Appendix is not dilated. There is mild diffuse wall thickening in colon. There is no pericolic stranding. Vascular/Lymphatic: Unremarkable. Reproductive: Unremarkable. Other: There is no ascites or pneumoperitoneum. Small umbilical hernia containing fat is seen. Musculoskeletal: Unremarkable. IMPRESSION: There is no evidence intestinal obstruction or pneumoperitoneum. There is no hydronephrosis. Appendix is not dilated. There is mild diffuse wall thickening in colon. This may be an artifact due to incomplete distention or suggest nonspecific colitis. There is no significant pericolic stranding or fluid collection. Electronically Signed   By: Rhae Hammock  Rathinasamy M.D.   On: 09/09/2022 14:27    Procedures Procedures   Medications Ordered in ED Medications  sodium chloride 0.9 % bolus 1,000 mL (1,000 mLs Intravenous New Bag/Given 09/09/22 1342)  ondansetron (ZOFRAN) injection 4 mg (4 mg Intravenous Given 09/09/22 1342)  iohexol (OMNIPAQUE) 300 MG/ML solution 100 mL (85 mLs Intravenous Contrast Given 09/09/22 1355)    ED Course/ Medical Decision Making/ A&P                           Medical Decision Making Amount and/or Complexity of Data Reviewed Labs: ordered.  Risk Prescription drug management.   31 year old male presents to the ED for evaluation.  Please see HPI for further details.  On examination patient afebrile and not tachycardic.  The patient and sounds are clear bilaterally, he is not hypoxic.  Patient abdomen is soft and compressible however he has not diffuse tenderness to his belly.  The patient is nontoxic in appearance.  Patient CMP unremarkable.  Patient lipase is unremarkable.  The patient respiratory panel is  negative.  Patient urinalysis shows trace proteins and ketones most likely due to volume depletion.  Patient CBC with leukocytosis of 13.9 however the patient is afebrile and nontachycardic.  This could be an acute phase reaction.  CT abdomen pelvis shows nonspecific colonic wall thickening diffusely.  Patient has history of IBS. Patient was given 1 L fluid, 4 mg Zofran and reports that he feels much better at this time.  Patient states that he drank an excessive amount of alcohol last night, this could be result of the patient having hangover.  I will advise the patient to treat his symptoms conservatively at home.  I have advised the patient to follow-up with his general practitioner for further management.  Patient was given return precautions and he voiced understanding.  The patient will be sent home with prescription of Zofran and advised to continue pushing fluids.    Final Clinical Impression(s) / ED Diagnoses Final diagnoses:  Nausea and vomiting, unspecified vomiting type    Rx / DC Orders ED Discharge Orders          Ordered    ondansetron (ZOFRAN) 4 MG tablet  Every 6 hours PRN        09/09/22 1537              Azucena Cecil, [place] 09/09/22 1537    Pattricia Boss, MD 09/10/22 [phone]

## 2023-01-12 ENCOUNTER — Emergency Department (HOSPITAL_BASED_OUTPATIENT_CLINIC_OR_DEPARTMENT_OTHER)
Admission: EM | Admit: 2023-01-12 | Discharge: 2023-01-12 | Disposition: A | Discharge status: Home / Self Care | Payer: Managed Care, Other (non HMO) | Attending: Emergency Medicine | Admitting: Emergency Medicine

## 2023-01-12 ENCOUNTER — Encounter (HOSPITAL_BASED_OUTPATIENT_CLINIC_OR_DEPARTMENT_OTHER): Payer: Self-pay | Admitting: Emergency Medicine

## 2023-01-12 ENCOUNTER — Other Ambulatory Visit: Payer: Self-pay

## 2023-01-12 DIAGNOSIS — Z20822 Contact with and (suspected) exposure to covid-19: Secondary | ICD-10-CM | POA: Insufficient documentation

## 2023-01-12 DIAGNOSIS — J069 Acute upper respiratory infection, unspecified: Secondary | ICD-10-CM | POA: Diagnosis not present

## 2023-01-12 DIAGNOSIS — R059 Cough, unspecified: Secondary | ICD-10-CM | POA: Diagnosis present

## 2023-01-12 LAB — RESP PANEL BY RT-PCR (RSV, FLU A&B, COVID)  RVPGX2
Influenza A by PCR: NEGATIVE
Influenza B by PCR: NEGATIVE
Resp Syncytial Virus by PCR: NEGATIVE
SARS Coronavirus 2 by RT PCR: NEGATIVE

## 2023-01-12 MED ORDER — PROMETHAZINE-DM 6.25-15 MG/5ML PO SYRP: 5.0000 mL | ORAL_SOLUTION | Freq: Four times a day (QID) | ORAL | 0 refills | Status: AC | PRN

## 2023-01-12 MED ORDER — ACETAMINOPHEN 500 MG PO TABS
1000.0000 mg | ORAL_TABLET | Freq: Once | ORAL | Status: AC
  Administered 2023-01-12: 1000 mg via ORAL
  Filled 2023-01-12: qty 2

## 2023-01-12 NOTE — Discharge Instructions (Addendum)
Your work-up in the ER today was reassuring for acute findings. You were swabbed for COVID, flu, and RSV which were negative. However, your symptoms are still likely related to an upper respiratory infection. As these are almost always viral in nature, no antibiotics are indicated. I recommend that you get plenty of rest and focus on symptomatic relief which includes Cepacol throat lozenges for sore throat, Mucinex D (orange box) which you can get from behind the counter at your local pharmacy for congestion, and tylenol/ibuprofen as needed for fevers and bodyaches. I have also given you a prescription for Phenergan cough syrup which is a cough suppressant medication for you to take as prescribed as needed for management of your symptoms. I also recommend:  Increased fluid intake. Sports drinks offer valuable electrolytes, sugars, and fluids.  Breathing heated mist or steam (vaporizer or shower).  Eating chicken soup or other clear broths, and maintaining good nutrition.   Increasing usage of your inhaler if you have asthma.  Return to work when your temperature has returned to normal.  Gargle warm salt water and spit it out for sore throat. Take benadryl or Zyrtec to decrease sinus secretions.  Follow Up: Follow up with your primary care doctor in 5-7 days for recheck of ongoing symptoms.  Return to emergency department for emergent changing or worsening of symptoms.

## 2023-01-12 NOTE — ED Provider Notes (Signed)
[facility] Provider Note   CSN: [id number] Arrival date & time: 01/12/23  2049     History  Chief Complaint  Patient presents with   Nasal Congestion    Dylan Douglas. is a 32 y.o. male.  Patient with history of PE presents today with complaints of cough, congestion, fever, and chills.  He states that same began today and has been persistent since then.  His child is present in the room and is sick with similar symptoms.  He has not tried anything for his symptoms prior to arrival today.  Endorses associated body aches.  Denies chest pain or shortness of breath.  No leg pain or leg swelling.  The history is provided by the patient. No language interpreter was used.       Home Medications Prior to Admission medications   Medication Sig Start Date End Date Taking? Authorizing Provider  acetaminophen (TYLENOL) 500 MG tablet Take 500-1,000 mg by mouth every 6 (six) hours as needed for mild pain, fever or headache.    [provider]  albuterol (PROAIR HFA) 108 (90 Base) MCG/ACT inhaler Inhale 2 puffs into the lungs See admin instructions. Inhale 2 puffs into the lungs every 4-6 hours as needed for shortness of breath or wheezing    [provider]  albuterol (PROVENTIL) (2.5 MG/3ML) 0.083% nebulizer solution Take 2.5 mg by nebulization every 6 (six) hours as needed for wheezing or shortness of breath.    [provider]  doxycycline (VIBRAMYCIN) 100 MG capsule Take 1 capsule (100 mg total) by mouth 2 (two) times daily. 09/11/21   Davonna Belling, MD  hydrOXYzine (ATARAX/VISTARIL) 25 MG tablet Take 1 tablet (25 mg total) by mouth every 6 (six) hours. 01/23/21   Wieters, Hallie C, PA-C  lidocaine (LIDODERM) 5 % Place 1 patch onto the skin daily. Remove & Discard patch within 12 hours or as directed by MD 08/16/20   Terrilee Croak, MD  ondansetron (ZOFRAN ODT) 4 MG disintegrating tablet Take 1-2 tablets (4-8 mg total)  by mouth every 8 (eight) hours as needed for nausea or vomiting. 01/23/21   Wieters, Hallie C, PA-C  ondansetron (ZOFRAN) 4 MG tablet Take 1 tablet (4 mg total) by mouth every 6 (six) hours as needed for nausea or vomiting. 09/09/22   Azucena Cecil, PA-C  dicyclomine (BENTYL) 20 MG tablet Take 1 tablet (20 mg total) by mouth 2 (two) times daily. Patient not taking: Reported on 04/30/2018 05/19/16 07/13/19  Carmin Muskrat, MD  metoCLOPramide (REGLAN) 10 MG tablet Take 1 tablet (10 mg total) by mouth 3 (three) times daily as needed for nausea (headache / nausea). Patient not taking: Reported on 04/30/2018 02/06/16 07/13/19  Noemi Chapel, MD      Allergies    Flagyl [metronidazole] and Flagyl [metronidazole]    Review of Systems   Review of Systems  HENT:  Positive for congestion.   Respiratory:  Positive for cough.   All other systems reviewed and are negative.   Physical Exam Updated Vital Signs BP (!) 156/97 (BP Location: Right Arm)   Pulse (!) 104   Temp (!) 100.5 F (38.1 C) (Oral)   Resp 18   Ht '5\' 9"'$  (1.753 m)   Wt 102.1 kg   SpO2 99%   BMI 33.23 kg/m  Physical Exam Vitals and nursing note reviewed.  Constitutional:      General: He is not in acute distress.    Appearance: Normal appearance.  He is normal weight. He is not ill-appearing, toxic-appearing or diaphoretic.  HENT:     Head: Normocephalic and atraumatic.  Neck:     Comments: No meningismus Cardiovascular:     Rate and Rhythm: Normal rate and regular rhythm.     Heart sounds: Normal heart sounds.  Pulmonary:     Effort: Pulmonary effort is normal. No respiratory distress.     Breath sounds: Normal breath sounds.  Abdominal:     General: Abdomen is flat.     Palpations: Abdomen is soft.     Tenderness: There is no abdominal tenderness.  Musculoskeletal:        General: Normal range of motion.     Cervical back: Normal range of motion and neck supple.     Right lower leg: No edema.     Left lower leg:  No edema.  Skin:    General: Skin is warm and dry.  Neurological:     General: No focal deficit present.     Mental Status: He is alert.  Psychiatric:        Mood and Affect: Mood normal.        Behavior: Behavior normal.     ED Results / Procedures / Treatments   Labs (all labs ordered are listed, but only abnormal results are displayed) Labs Reviewed  RESP PANEL BY RT-PCR (RSV, FLU A&B, COVID)  [id number]    EKG None  Radiology No results found.  Procedures Procedures    Medications Ordered in ED Medications  acetaminophen (TYLENOL) tablet 1,000 mg (1,000 mg Oral Given 01/12/23 2151)    ED Course/ Medical Decision Making/ A&P                             Medical Decision Making Risk OTC drugs.   Patient presents today with complaints of cough and congestion since this morning.  He is notably febrile at 100.5 improved with Tylenol.  He is nontoxic-appearing, and in no acute distress with reassuring vital signs.  COVID, flu, and RSV are all negative.  Patient's lung sounds are clear to auscultation in all fields and he denies any chest pain or shortness of breath.  No leg pain, leg swelling, recent travel, or recent surgeries.  Very low suspicion for recurrent PE.  Patient's symptoms likely due to a URI, his son is in the room sick with similar symptoms.  Discussed with patient that this is likely a virus and therefore no antibiotics are indicated.  Will give Phenergan cough syrup for symptomatic relief and recommend Tylenol/ibuprofen as needed for fevers and bodyaches. Evaluation and diagnostic testing in the emergency department does not suggest an emergent condition requiring admission or immediate intervention beyond what has been performed at this time.  Plan for discharge with close PCP follow-up.  Patient is understanding and amenable with plan, educated on red flag symptoms that would prompt immediate return.  Patient discharged in stable condition.   Final Clinical  Impression(s) / ED Diagnoses Final diagnoses:  Viral URI with cough    Rx / DC Orders ED Discharge Orders          Ordered    promethazine-dextromethorphan (PROMETHAZINE-DM) 6.25-15 MG/5ML syrup  4 times daily PRN        01/12/23 2209          An After Visit Summary was printed and given to the patient.     Bud Face, PA-C 01/12/23 2210  Fredia Sorrow, MD 01/13/23 [phone]

## 2023-01-12 NOTE — ED Triage Notes (Signed)
Patient reports flu like symptoms that started last night.  Patient reports runny nose, congestion and cough.

## 2023-01-12 NOTE — ED Notes (Signed)
Reviewed AVS/discharge instruction with patient. Time allotted for and all questions answered. Patient is agreeable for d/c and escorted to ed exit by staff.  

## 2023-01-17 ENCOUNTER — Other Ambulatory Visit: Payer: Self-pay

## 2023-01-17 ENCOUNTER — Encounter (HOSPITAL_COMMUNITY): Payer: Self-pay

## 2023-01-17 ENCOUNTER — Emergency Department (HOSPITAL_COMMUNITY)
Admission: EM | Admit: 2023-01-17 | Discharge: 2023-01-17 | Disposition: A | Discharge status: Home / Self Care | Payer: Managed Care, Other (non HMO) | Attending: Emergency Medicine | Admitting: Emergency Medicine

## 2023-01-17 ENCOUNTER — Emergency Department (HOSPITAL_COMMUNITY): Payer: Managed Care, Other (non HMO)

## 2023-01-17 DIAGNOSIS — R059 Cough, unspecified: Secondary | ICD-10-CM | POA: Diagnosis present

## 2023-01-17 DIAGNOSIS — J069 Acute upper respiratory infection, unspecified: Secondary | ICD-10-CM | POA: Insufficient documentation

## 2023-01-17 LAB — CBC WITH DIFFERENTIAL/PLATELET
Abs Immature Granulocytes: 0.12 10*3/uL — ABNORMAL HIGH (ref 0.00–0.07)
Basophils Absolute: 0.1 10*3/uL (ref 0.0–0.1)
Basophils Relative: 1 %
Eosinophils Absolute: 0.2 10*3/uL (ref 0.0–0.5)
Eosinophils Relative: 1 %
HCT: 45.5 % (ref 39.0–52.0)
Hemoglobin: 16.1 g/dL (ref 13.0–17.0)
Immature Granulocytes: 1 %
Lymphocytes Relative: 12 %
Lymphs Abs: 1.5 10*3/uL (ref 0.7–4.0)
MCH: 30.6 pg (ref 26.0–34.0)
MCHC: 35.4 g/dL (ref 30.0–36.0)
MCV: 86.3 fL (ref 80.0–100.0)
Monocytes Absolute: 1.3 10*3/uL — ABNORMAL HIGH (ref 0.1–1.0)
Monocytes Relative: 11 %
Neutro Abs: 9 10*3/uL — ABNORMAL HIGH (ref 1.7–7.7)
Neutrophils Relative %: 74 %
Platelets: 264 10*3/uL (ref 150–400)
RBC: 5.27 MIL/uL (ref 4.22–5.81)
RDW: 13.5 % (ref 11.5–15.5)
WBC: 12.2 10*3/uL — ABNORMAL HIGH (ref 4.0–10.5)
nRBC: 0 % (ref 0.0–0.2)

## 2023-01-17 LAB — BASIC METABOLIC PANEL
Anion gap: 11 (ref 5–15)
BUN: 14 mg/dL (ref 6–20)
CO2: 26 mmol/L (ref 22–32)
Calcium: 9.1 mg/dL (ref 8.9–10.3)
Chloride: 100 mmol/L (ref 98–111)
Creatinine, Ser: 0.89 mg/dL (ref 0.61–1.24)
GFR, Estimated: >60 mL/min (ref >60)
Glucose, Bld: 98 mg/dL (ref 70–99)
Potassium: 3.6 mmol/L (ref 3.5–5.1)
Sodium: 137 mmol/L (ref 135–145)

## 2023-01-17 LAB — D-DIMER, QUANTITATIVE: D-Dimer, Quant: 0.3 ug/mL-FEU (ref 0.00–0.50)

## 2023-01-17 MED ORDER — BENZONATATE 100 MG PO CAPS: 100.0000 mg | ORAL_CAPSULE | Freq: Three times a day (TID) | ORAL | 0 refills | Status: AC | PRN

## 2023-01-17 NOTE — ED Triage Notes (Addendum)
Patient seen 4 days ago with upper respiratory virus. Negative for covid and flu. Said every time he goes to sleep he "cannot get the mucus out." Right sided rib pain that moves to his lower back. Woke up this morning with a fever of 104F, took Nyquil last night. Has nasal congestion and coughing up light green. He thinks he has pneumonia and felt this way last time.

## 2023-01-17 NOTE — ED Provider Notes (Signed)
[facility] Provider Note   CSN: [id number] Arrival date & time: 01/17/23  [id number]     History  Chief Complaint  Patient presents with   Cough    Dylan Woodrum. is a 32 y.o. male who presents to the ED complaining of cough, congestion, fever, and chills for the last week.  Patient reports that he was evaluated in the ED on 3/6 and had negative viral swabs and was discharged home with cough medication.  He states that he took 1 dose of this medication but it "made me feel funny" so he stopped taking it has been taking over-the-counter NyQuil instead.  He reports that symptoms have been persistent and he has gradually started to feel short of breath and is concerned that he may have blood clots.  He reports a history of PE in 2020 after being diagnosed with COVID-19.  He denies chest pain, palpitations, lightheadedness, dizziness, abdominal pain, nausea, vomiting, or diarrhea.  He denies leg pain or leg swelling.  He states that he was on anticoagulants for a short period of time but was taken off by his doctor after approximately 6 months.      Home Medications Prior to Admission medications   Medication Sig Start Date End Date Taking? Authorizing Provider  benzonatate (TESSALON) 100 MG capsule Take 1 capsule (100 mg total) by mouth 3 (three) times daily as needed for up to 5 days for cough. 01/17/23 01/22/23 Yes Jo Cerone L, PA-C  acetaminophen (TYLENOL) 500 MG tablet Take 500-1,000 mg by mouth every 6 (six) hours as needed for mild pain, fever or headache.    [provider]  albuterol (PROAIR HFA) 108 (90 Base) MCG/ACT inhaler Inhale 2 puffs into the lungs See admin instructions. Inhale 2 puffs into the lungs every 4-6 hours as needed for shortness of breath or wheezing    [provider]  albuterol (PROVENTIL) (2.5 MG/3ML) 0.083% nebulizer solution Take 2.5 mg by nebulization every 6 (six) hours as needed for wheezing or  shortness of breath.    [provider]  doxycycline (VIBRAMYCIN) 100 MG capsule Take 1 capsule (100 mg total) by mouth 2 (two) times daily. 09/11/21   Davonna Belling, MD  hydrOXYzine (ATARAX/VISTARIL) 25 MG tablet Take 1 tablet (25 mg total) by mouth every 6 (six) hours. 01/23/21   Wieters, Hallie C, PA-C  lidocaine (LIDODERM) 5 % Place 1 patch onto the skin daily. Remove & Discard patch within 12 hours or as directed by MD 08/16/20   Terrilee Croak, MD  ondansetron (ZOFRAN ODT) 4 MG disintegrating tablet Take 1-2 tablets (4-8 mg total) by mouth every 8 (eight) hours as needed for nausea or vomiting. 01/23/21   Wieters, Hallie C, PA-C  ondansetron (ZOFRAN) 4 MG tablet Take 1 tablet (4 mg total) by mouth every 6 (six) hours as needed for nausea or vomiting. 09/09/22   Azucena Cecil, PA-C  promethazine-dextromethorphan (PROMETHAZINE-DM) 6.25-15 MG/5ML syrup Take 5 mLs by mouth 4 (four) times daily as needed for cough. 01/12/23   Smoot, Leary Roca, PA-C  dicyclomine (BENTYL) 20 MG tablet Take 1 tablet (20 mg total) by mouth 2 (two) times daily. Patient not taking: Reported on 04/30/2018 05/19/16 07/13/19  Carmin Muskrat, MD  metoCLOPramide (REGLAN) 10 MG tablet Take 1 tablet (10 mg total) by mouth 3 (three) times daily as needed for nausea (headache / nausea). Patient not taking: Reported on 04/30/2018 02/06/16 07/13/19  Noemi Chapel, MD  Allergies    Flagyl [metronidazole] and Flagyl [metronidazole]    Review of Systems   Review of Systems  All other systems reviewed and are negative.   Physical Exam Updated Vital Signs BP (!) 155/97 (BP Location: Left Arm)   Pulse (!) 101   Temp 99.9 F (37.7 C) (Oral)   Resp 18   Ht '5\' 10"'$  (1.778 m)   Wt 99.8 kg   SpO2 98%   BMI 31.57 kg/m  Physical Exam Vitals and nursing note reviewed.  Constitutional:      General: He is not in acute distress.    Appearance: Normal appearance. He is not toxic-appearing or diaphoretic.  HENT:      Head: Normocephalic and atraumatic.     Nose: Congestion present.     Mouth/Throat:     Mouth: Mucous membranes are moist.     Pharynx: Oropharynx is clear. Posterior oropharyngeal erythema (mild posterior) present. No oropharyngeal exudate.  Eyes:     General: No scleral icterus.    Conjunctiva/sclera: Conjunctivae normal.  Cardiovascular:     Rate and Rhythm: Normal rate and regular rhythm.     Heart sounds: Normal heart sounds. No murmur heard. Pulmonary:     Effort: Pulmonary effort is normal. No respiratory distress.     Breath sounds: Normal breath sounds. No stridor. No wheezing, rhonchi or rales.  Chest:     Chest wall: No tenderness.  Abdominal:     General: Abdomen is flat. There is no distension.     Palpations: Abdomen is soft.     Tenderness: There is no abdominal tenderness. There is no right CVA tenderness, left CVA tenderness, guarding or rebound.  Musculoskeletal:        General: Normal range of motion.     Cervical back: Normal range of motion and neck supple. No rigidity.     Right lower leg: No edema.     Left lower leg: No edema.     Comments: No calf tenderness bilaterally  Skin:    General: Skin is warm and dry.     Capillary Refill: Capillary refill takes less than 2 seconds.  Neurological:     General: No focal deficit present.     Mental Status: He is alert and oriented to person, place, and time. Mental status is at baseline.  Psychiatric:        Mood and Affect: Mood normal.        Behavior: Behavior normal.     ED Results / Procedures / Treatments   Labs (all labs ordered are listed, but only abnormal results are displayed) Labs Reviewed  CBC WITH DIFFERENTIAL/PLATELET - Abnormal; Notable for the following components:      Result Value   WBC 12.2 (*)    Neutro Abs 9.0 (*)    Monocytes Absolute 1.3 (*)    Abs Immature Granulocytes 0.12 (*)    All other components within normal limits  BASIC METABOLIC PANEL  D-DIMER, QUANTITATIVE     EKG Sinus tachycardia at 105 bpm, no acute ST-T changes, no STEMI  Radiology DG Chest 2 View  Result Date: 01/17/2023 CLINICAL DATA:  Productive cough, upper respiratory virus EXAM: CHEST - 2 VIEW COMPARISON:  08/14/2020 FINDINGS: The heart size and mediastinal contours are within normal limits. Both lungs are clear. The visualized skeletal structures are unremarkable. IMPRESSION: No acute abnormality of the lungs. Electronically Signed   By: Delanna Ahmadi M.D.   On: 01/17/2023 08:43    Procedures Procedures  Medications Ordered in ED Medications - No data to display  ED Course/ Medical Decision Making/ A&P                             Medical Decision Making Amount and/or Complexity of Data Reviewed Labs: ordered. Decision-making details documented in ED Course. Radiology: ordered. Decision-making details documented in ED Course. ECG/medicine tests: ordered. Decision-making details documented in ED Course.  Risk Prescription drug management.   Medical Decision Making:   Dylan Douglas. is a 32 y.o. male who presented to the ED today with URI symptoms and gradually onset dyspnea detailed above.    Patient's presentation is complicated by their history of PE, current URI.  Complete initial physical exam performed, notably the patient  was in no acute distress with lungs clear to auscultation.  Patient mildly tachycardic at 101 and vital signs but this has resolved on my initial evaluation and he has no tachycardia and a normal heart exam.  No lower extremity edema or calf tenderness.  No abdominal tenderness.  Patient has bilateral nasal congestion and posterior oropharyngeal erythema but a patent airway.    Reviewed and confirmed nursing documentation for past medical history, family history, social history.    Initial Assessment:   With the patient's presentation of shortness of breath, most likely diagnosis is viral URI. The emergent causes for shortness of breath  include but are not limited to Cardiac (AHF, pericardial effusion and tamponade, arrhythmias, ischemia, etc) Respiratory (COPD, asthma, pneumonia, pneumothorax, primary pulmonary hypertension, PE/VQ mismatch) Hematological (anemia) Neuromuscular (ALS, Guillain-Barr, etc)  This is most consistent with an acute complicated illness  Initial Plan:  Screening labs including CBC and Metabolic panel to evaluate for infectious or metabolic etiology of disease.  CXR to evaluate for structural/infectious intrathoracic pathology.  EKG to evaluate for cardiac pathology D-dimer to assess probability of PE Objective evaluation as reviewed   Initial Study Results:   Laboratory  All laboratory results reviewed without evidence of clinically relevant pathology.   Exceptions include: WBC 12.2  Radiology:  All images reviewed independently. Agree with radiology report at this time.   DG Chest 2 View  Result Date: 01/17/2023 CLINICAL DATA:  Productive cough, upper respiratory virus EXAM: CHEST - 2 VIEW COMPARISON:  08/14/2020 FINDINGS: The heart size and mediastinal contours are within normal limits. Both lungs are clear. The visualized skeletal structures are unremarkable. IMPRESSION: No acute abnormality of the lungs. Electronically Signed   By: Delanna Ahmadi M.D.   On: 01/17/2023 08:43     Final Assessment and Plan:   This is a 32 year old male presenting to the ED complaining of URI symptoms for the last week.  Patient states that he was evaluated in the ED where he had negative viral swabs but he is continue to have symptoms.  He reports somewhat vague shortness of breath that has caused him concern for possible blood clot.  He reports that he does have a history of pulmonary embolism back in 2020 after being diagnosed with COVID-19.  He is no longer on anticoagulants.  Apart from this, he has no other risk factors including but not limited to recent travel, recent surgery or hormone use.  He denies leg  pain or leg swelling.  He denies chest pain.  He is slightly tachycardic with initial vital signs with a pulse of 101 but this has resolved by my initial exam and he remains within normal heart rate on  multiple reexaminations.  He has normal respirations and normal oxygen saturation on room air with no signs of acute distress.  With patient's history, workup obtained as above for further evaluation.  Discussed plan with patient to obtain chest x-ray to rule out a pneumonia which is also another concern of his as well as D-dimer to assess for probability of PE.  Patient agreeable with this plan.  EKG with sinus tachycardia but otherwise normal.  He has a very mild leukocytosis at 12.2 and negative chest x-ray without signs of pneumonia or fluid overload.  Remainder of blood work is reassuring.  Reviewed viral swabs from previous visit which were also negative.  D-dimer negative during today's visit.  Discussed all findings with patient as well as treatment for home.  Will provide him with a new cough medication as the one previously prescribed had adverse side effects for him.  Patient is aware that viral illnesses can last up to 2 weeks.  Extensive discussion regarding home treatment for symptomatic relief. Pt expressed understanding of plan. Strict ED return precautions given, all questions answered, and stable for discharge.    Clinical Impression:  1. Acute upper respiratory infection      Discharge           Final Clinical Impression(s) / ED Diagnoses Final diagnoses:  Acute upper respiratory infection    Rx / DC Orders ED Discharge Orders          Ordered    benzonatate (TESSALON) 100 MG capsule  3 times daily PRN        01/17/23 1025              Suzzette Righter, PA-C 01/17/23 1640    Lajean Saver, MD 01/21/23 1657

## 2023-01-17 NOTE — Discharge Instructions (Addendum)
Thank you for letting us take care of you today.  Overall, your workup was reassuring.  The D-dimer test that we talked about to check for blood clots was negative.  You did have a slight white count which is normal while dealing with an infection.  You do not have signs of a pneumonia on your chest x-ray.  Your symptoms are likely related to a viral illness.  I am prescribing a different cough medicine for you to take at home as needed since you had side effects with the medicine previously prescribed.  Please follow-up with your PCP in the next 2 days to discuss your ED visits and any lingering symptoms.  Please see the instructions below for dealing with a viral illness.  If you develop any new, worsening, emergent symptoms, please return to the nearest emergency department for reevaluation.  Viral Illness TREATMENT  Treatment is directed at relieving symptoms. There is no cure. Antibiotics are not effective because the infection is caused by a virus, not by bacteria. Treatment may include:  Increased fluid intake. Sports drinks offer valuable electrolytes, sugars, and fluids.  Breathing heated mist or steam (vaporizer or shower).  Eating chicken soup or other clear broths, and maintaining good nutrition.  Getting plenty of rest.  Using gargles or lozenges for comfort.  Increasing usage of your inhaler if you have asthma.  Return to work when your temperature has returned to normal.  Gargle warm salt water and spit it out for sore throat. Take benadryl to decrease sinus secretions. Continue to alternate between Tylenol and ibuprofen for pain and fever control.  Follow Up: Follow up with your primary care doctor in 5-7 days for recheck of ongoing symptoms.  Return to emergency department for emergent changing or worsening of symptoms.

## 2023-06-29 IMAGING — CT CT ANGIO CHEST
2 of 7 series · 17 of 46 positions shown · IV contrast (omnipaque)
Comparison: 08/14/2020 chest CT angiogram.

CLINICAL DATA: PE suspected, high prob. Previous history of
pulmonary embolism. Rhinorrhea. Generalized body aches. Sore throat.
Mild productive cough.

EXAM:
CT ANGIOGRAPHY CHEST WITH CONTRAST
TECHNIQUE: Multidetector CT imaging of the chest was performed using the
standard protocol during bolus administration of intravenous
contrast. Multiplanar CT image reconstructions and MIPs were
obtained to evaluate the vascular anatomy.
CONTRAST:  75mL OMNIPAQUE IOHEXOL 350 MG/ML SOLN

[Series 5: pe axial thins · axial · 0.82mm/px · z∈[-175,+91]mm · 14 of 308 slices shown]
[im 21/308  lung]
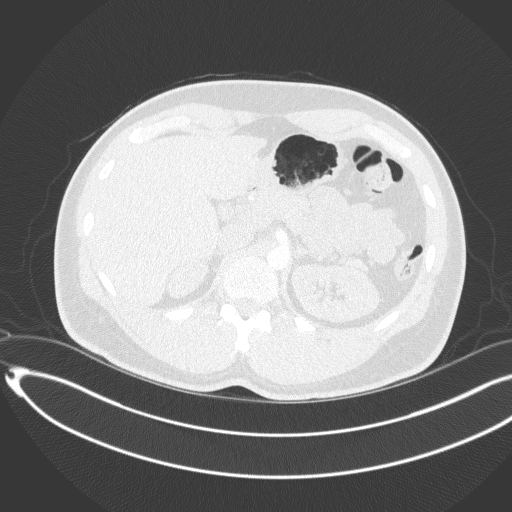
[im 41/308  soft-tissue]
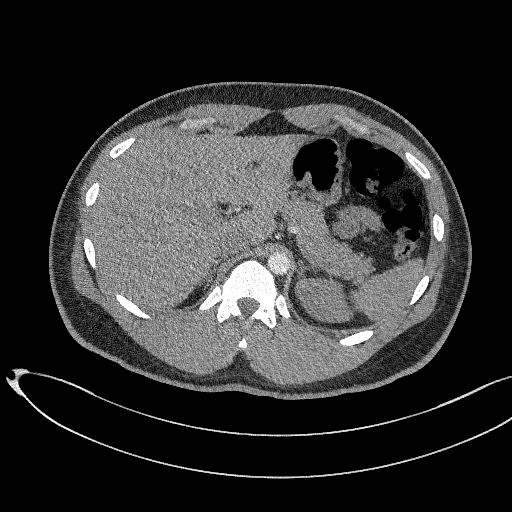
[im 62/308  lung]
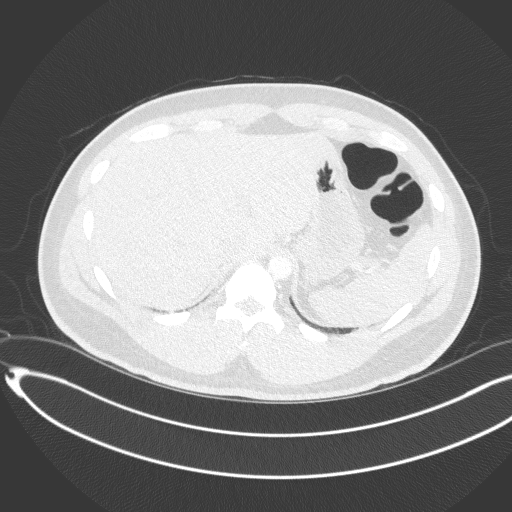
[im 82/308  soft-tissue]
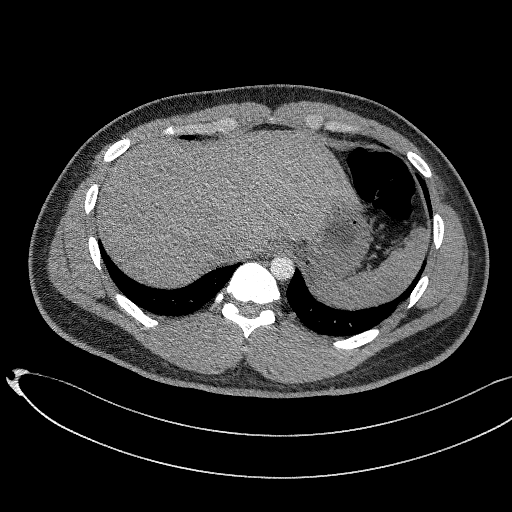
[im 103/308  lung]
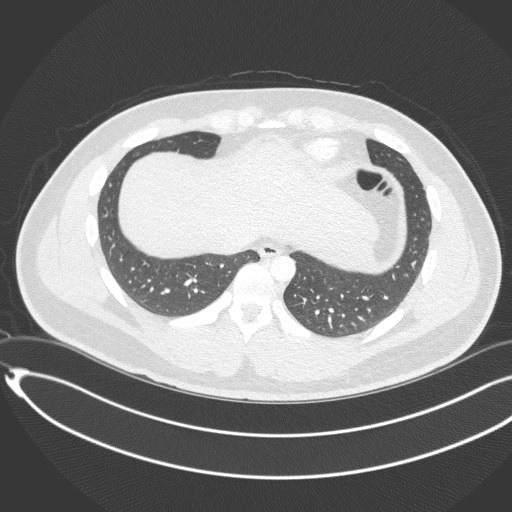
[im 123/308  soft-tissue]
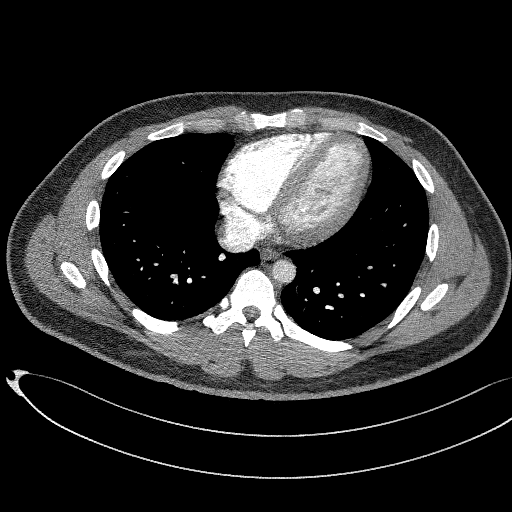
[im 144/308  lung]
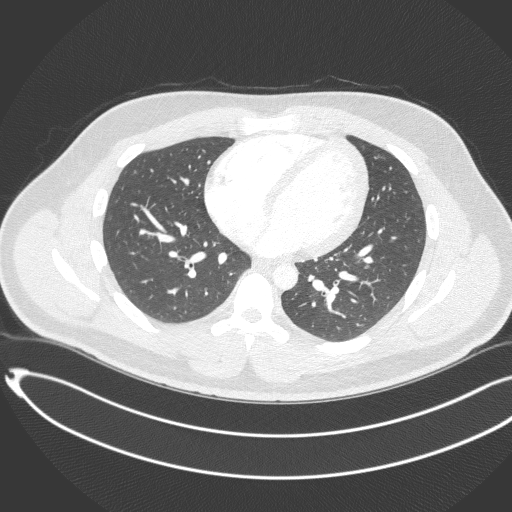
[im 164/308  soft-tissue]
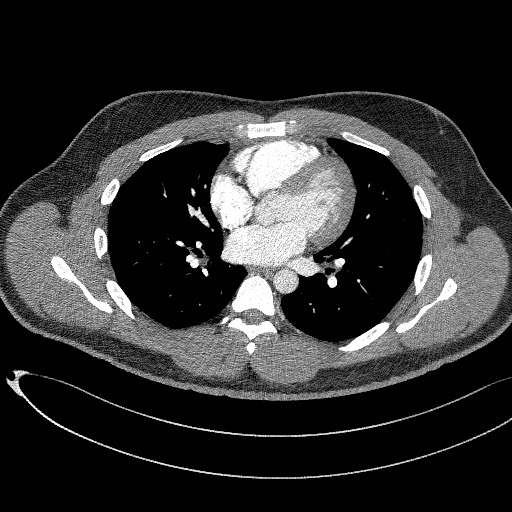
[im 185/308  lung]
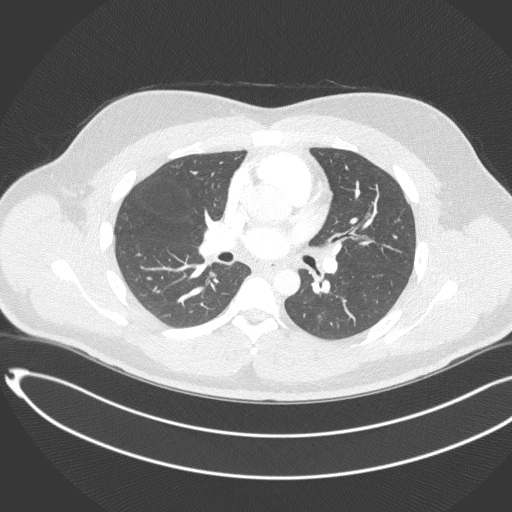
[im 205/308  soft-tissue]
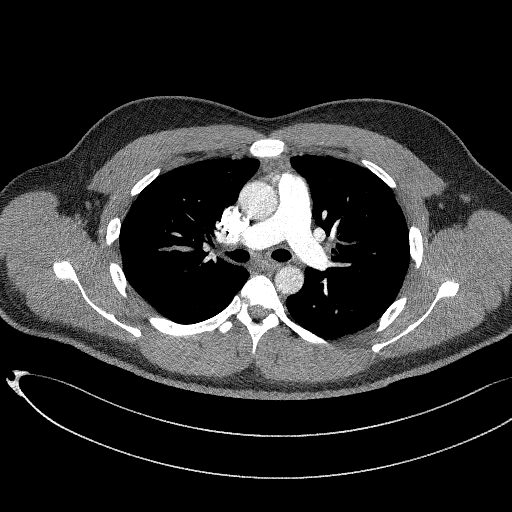
[im 226/308  lung]
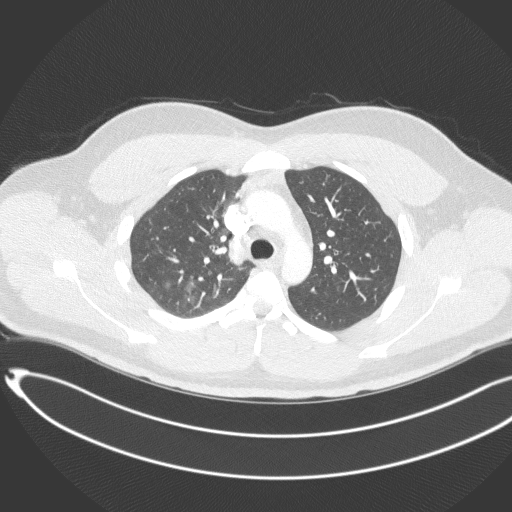
[im 246/308  soft-tissue]
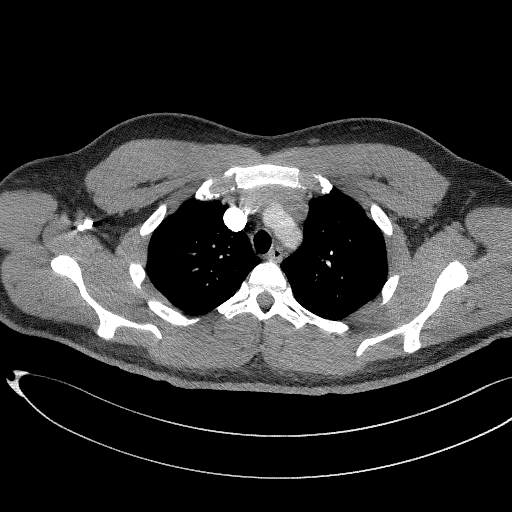
[im 267/308  lung]
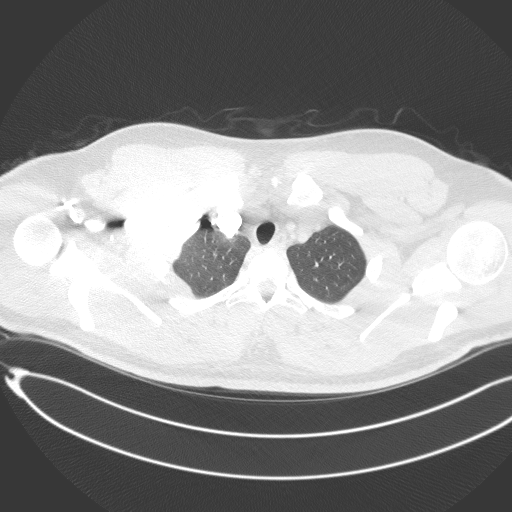
[im 287/308  soft-tissue]
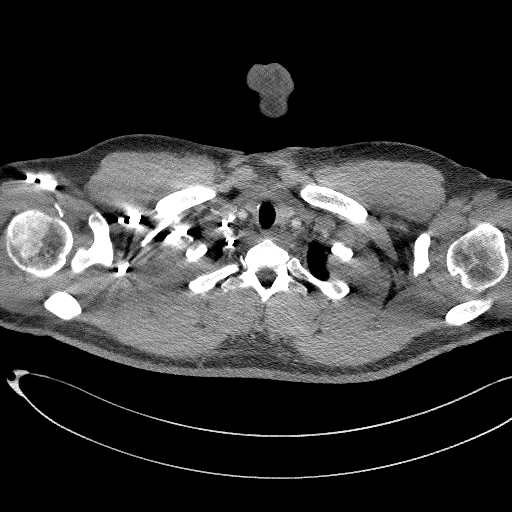

[Series 7: cor soft · coronal · 0.66mm/px · 3 of 151 slices shown]
[im 38/151  soft-tissue]
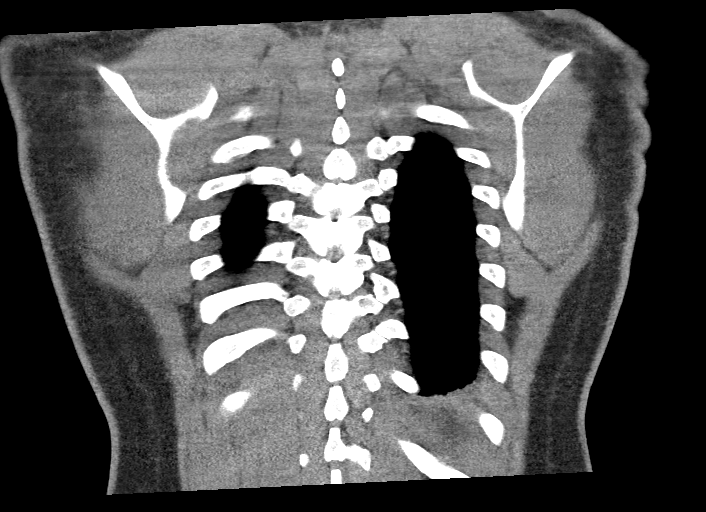
[im 76/151  soft-tissue]
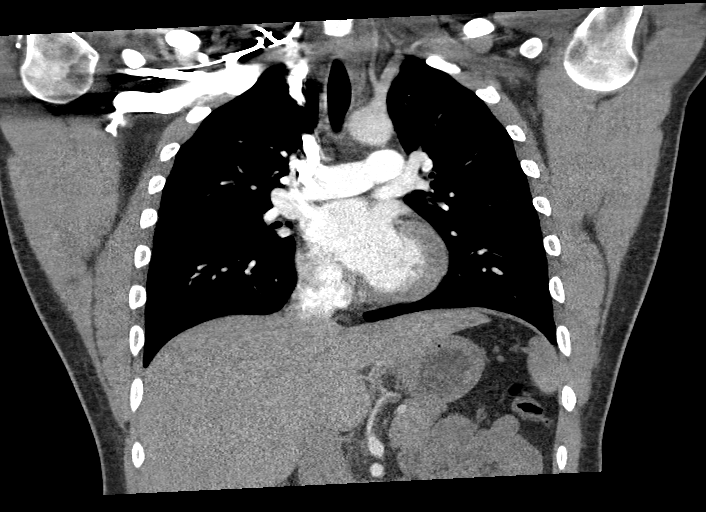
[im 113/151  soft-tissue]
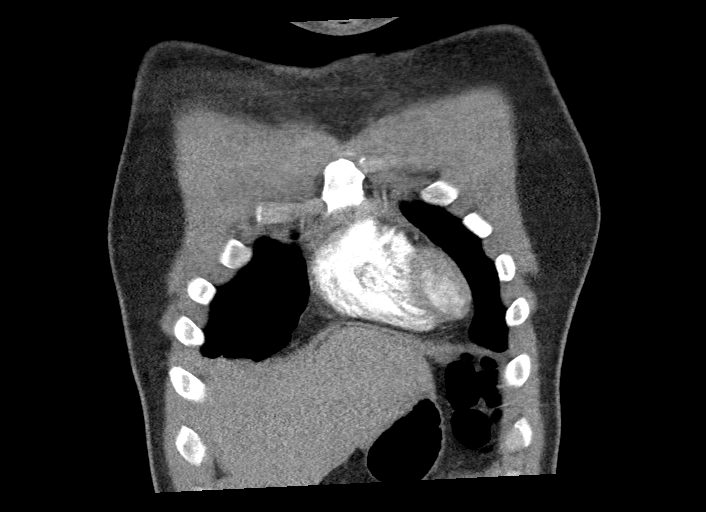

[17 of 46 positions shown; findings below may reference images not displayed]

FINDINGS: Cardiovascular: The study is high quality for the evaluation of
pulmonary embolism. There are no convincing filling defects in the
central, lobar, segmental or subsegmental pulmonary artery branches
to suggest acute pulmonary embolism. Previously visualized acute
bilateral pulmonary emboli have resolved. Great vessels are normal
in course and caliber. Normal heart size. No significant pericardial
fluid/thickening.

Mediastinum/Nodes: No discrete thyroid nodules. Unremarkable
esophagus. No pathologically enlarged axillary, mediastinal or hilar
lymph nodes. Wedge-shaped mixed soft tissue and stable fat density
in the anterior mediastinum (series 4/image 40), compatible with
mild thymic hyperplasia, similar.

Lungs/Pleura: No pneumothorax. No pleural effusion. Mild patchy
peribronchovascular ground-glass opacity and minimal patchy
consolidation in the posterior right upper lobe and posteromedial
left lower lobe, new from prior chest CT. No lung masses or
significant pulmonary nodules.

Upper abdomen: No acute abnormality.

Musculoskeletal:  No aggressive appearing focal osseous lesions.

Review of the MIP images confirms the above findings.
IMPRESSION: 1. No acute pulmonary embolism.
2. Mild patchy peribronchovascular ground-glass opacity and minimal
patchy consolidation in the posterior right upper lobe and
posteromedial left lower lobe, new from prior chest CT, most
compatible with mild multilobar bronchopneumonia.

## 2023-09-02 ENCOUNTER — Emergency Department (HOSPITAL_BASED_OUTPATIENT_CLINIC_OR_DEPARTMENT_OTHER)
Admission: EM | Admit: 2023-09-02 | Discharge: 2023-09-02 | Disposition: A | Discharge status: Home / Self Care | Payer: Managed Care, Other (non HMO) | Attending: Emergency Medicine | Admitting: Emergency Medicine

## 2023-09-02 ENCOUNTER — Encounter (HOSPITAL_BASED_OUTPATIENT_CLINIC_OR_DEPARTMENT_OTHER): Payer: Self-pay

## 2023-09-02 ENCOUNTER — Other Ambulatory Visit: Payer: Self-pay

## 2023-09-02 ENCOUNTER — Emergency Department (HOSPITAL_BASED_OUTPATIENT_CLINIC_OR_DEPARTMENT_OTHER): Payer: Managed Care, Other (non HMO) | Admitting: Radiology

## 2023-09-02 DIAGNOSIS — M545 Low back pain, unspecified: Secondary | ICD-10-CM | POA: Diagnosis present

## 2023-09-02 DIAGNOSIS — F17210 Nicotine dependence, cigarettes, uncomplicated: Secondary | ICD-10-CM | POA: Diagnosis not present

## 2023-09-02 MED ORDER — METHOCARBAMOL 500 MG PO TABS: 500.0000 mg | ORAL_TABLET | Freq: Two times a day (BID) | ORAL | 0 refills | Status: AC | PRN

## 2023-09-02 MED ORDER — LIDOCAINE 5 % EX PTCH
1.0000 | MEDICATED_PATCH | CUTANEOUS | Status: DC
  Administered 2023-09-02: 1 via TRANSDERMAL
  Filled 2023-09-02: qty 1

## 2023-09-02 MED ORDER — KETOROLAC TROMETHAMINE 30 MG/ML IJ SOLN
30.0000 mg | Freq: Once | INTRAMUSCULAR | Status: AC
  Administered 2023-09-02: 30 mg via INTRAMUSCULAR
  Filled 2023-09-02: qty 1

## 2023-09-02 MED ORDER — KETOROLAC TROMETHAMINE 10 MG PO TABS: 10.0000 mg | ORAL_TABLET | Freq: Four times a day (QID) | ORAL | 0 refills | Status: AC | PRN

## 2023-09-02 MED ORDER — LIDOCAINE 5 % EX PTCH: 1.0000 | MEDICATED_PATCH | CUTANEOUS | 0 refills | Status: AC

## 2023-09-02 NOTE — Discharge Instructions (Addendum)
As discussed, workup today overall reassuring.  X-ray negative.  Suspect your symptoms are likely secondary to SI joint dysfunction given area of tenderness on exam.  Will treat area with NSAIDs as well as numbing patches to use as needed.  Will send you home with a muscle laxer to help you get some sleep at night.  This medication can be sedating so please do not drive or perform any high risk activity and to realize its effects on you.  Attached is number for orthopedic specialist to follow-up with for further assessment/evaluation.  Please do not hesitate to return to emergency department if the worrisome signs and symptoms we discussed become apparent.

## 2023-09-02 NOTE — ED Triage Notes (Signed)
L sided lower back pain x2 weeks. Pt states that pain has worsened. Ambulatory to triage. No injury reported.

## 2023-09-02 NOTE — ED Provider Notes (Signed)
[place] EMERGENCY DEPARTMENT AT [facility] Provider Note   CSN: [id number] Arrival date & time: 09/02/23  1537     History  Chief Complaint  Patient presents with   Back Pain    Eton Sentz. is a 32 y.o. male.   Back Pain   32 year old male presents emergency department with complaints of left low back/hip pain for the past couple of weeks.  States that he was sitting at the office when it began.  Reports constant sharp pain since onset.  States that pain is worsened with movement of his back as well as his left hip.  Has taken at home Tylenol without improvement.  Denies any fever, bowel/bladder dysfunction, weakness/sensory deficits lower extremities, saddle esthesia, history of IV drug use, prolonged corticosteroid use.  Denies any trauma/falls.  Past medical history significant for PE, palpitations, IBS  Home Medications Prior to Admission medications   Medication Sig Start Date End Date Taking? Authorizing Provider  ketorolac (TORADOL) 10 MG tablet Take 1 tablet (10 mg total) by mouth every 6 (six) hours as needed. 09/02/23  Yes Sherian Maroon A, PA  lidocaine (LIDODERM) 5 % Place 1 patch onto the skin daily. Remove & Discard patch within 12 hours or as directed by MD 09/02/23  Yes Sherian Maroon A, PA  methocarbamol (ROBAXIN) 500 MG tablet Take 1 tablet (500 mg total) by mouth 2 (two) times daily as needed for muscle spasms. 09/02/23  Yes Sherian Maroon A, PA  acetaminophen (TYLENOL) 500 MG tablet Take 500-1,000 mg by mouth every 6 (six) hours as needed for mild pain, fever or headache.    [provider]  albuterol (PROAIR HFA) 108 (90 Base) MCG/ACT inhaler Inhale 2 puffs into the lungs See admin instructions. Inhale 2 puffs into the lungs every 4-6 hours as needed for shortness of breath or wheezing    [provider]  albuterol (PROVENTIL) (2.5 MG/3ML) 0.083% nebulizer solution Take 2.5 mg by nebulization every 6 (six) hours as needed  for wheezing or shortness of breath.    [provider]  doxycycline (VIBRAMYCIN) 100 MG capsule Take 1 capsule (100 mg total) by mouth 2 (two) times daily. 09/11/21   Benjiman Core, MD  hydrOXYzine (ATARAX/VISTARIL) 25 MG tablet Take 1 tablet (25 mg total) by mouth every 6 (six) hours. 01/23/21   Wieters, Hallie C, PA-C  ondansetron (ZOFRAN ODT) 4 MG disintegrating tablet Take 1-2 tablets (4-8 mg total) by mouth every 8 (eight) hours as needed for nausea or vomiting. 01/23/21   Wieters, Hallie C, PA-C  ondansetron (ZOFRAN) 4 MG tablet Take 1 tablet (4 mg total) by mouth every 6 (six) hours as needed for nausea or vomiting. 09/09/22   Al Decant, PA-C  promethazine-dextromethorphan (PROMETHAZINE-DM) 6.25-15 MG/5ML syrup Take 5 mLs by mouth 4 (four) times daily as needed for cough. 01/12/23   Smoot, Shawn Route, PA-C  dicyclomine (BENTYL) 20 MG tablet Take 1 tablet (20 mg total) by mouth 2 (two) times daily. Patient not taking: Reported on 04/30/2018 05/19/16 07/13/19  Gerhard Munch, MD  metoCLOPramide (REGLAN) 10 MG tablet Take 1 tablet (10 mg total) by mouth 3 (three) times daily as needed for nausea (headache / nausea). Patient not taking: Reported on 04/30/2018 02/06/16 07/13/19  Eber Hong, MD      Allergies    Flagyl [metronidazole] and Flagyl [metronidazole]    Review of Systems   Review of Systems  Musculoskeletal:  Positive for back pain.  All other systems reviewed  and are negative.   Physical Exam Updated Vital Signs BP 129/87   Pulse 90   Temp 98.3 F (36.8 C) (Oral)   Resp 17   Ht 5\' 10"  (1.778 m)   Wt 99.8 kg   SpO2 98%   BMI 31.57 kg/m  Physical Exam Vitals and nursing note reviewed.  Constitutional:      General: He is not in acute distress.    Appearance: He is well-developed.  HENT:     Head: Normocephalic and atraumatic.  Eyes:     Conjunctiva/sclera: Conjunctivae normal.  Cardiovascular:     Rate and Rhythm: Normal rate and regular rhythm.      Heart sounds: No murmur heard. Pulmonary:     Effort: Pulmonary effort is normal. No respiratory distress.     Breath sounds: Normal breath sounds.  Abdominal:     Palpations: Abdomen is soft.     Tenderness: There is no abdominal tenderness.  Musculoskeletal:        General: No swelling.     Cervical back: Neck supple.     Comments: No midline tenderness of cervical, thoracic, lumbar spine.  Mild paraspinal tenderness in the left lower lumbar/sacral area.  Tenderness posteriorly SI joint.  Patient with pain elicited with range of motion of left hip.  Muscular strength 5 out of 5 for ankle dorsi/plantarflexion, knee flexion/extension, hip flexion/extension.  Pedal pulses 2+ bilaterally.  No sensory deficits along major nerve distributions of lower extremities.  Skin:    General: Skin is warm and dry.     Capillary Refill: Capillary refill takes less than 2 seconds.  Neurological:     Mental Status: He is alert.  Psychiatric:        Mood and Affect: Mood normal.     ED Results / Procedures / Treatments   Labs (all labs ordered are listed, but only abnormal results are displayed) Labs Reviewed - No data to display  EKG None  Radiology No results found.  Procedures Procedures    Medications Ordered in ED Medications  lidocaine (LIDODERM) 5 % 1 patch (1 patch Transdermal Patch Applied 09/02/23 1756)  ketorolac (TORADOL) 30 MG/ML injection 30 mg (30 mg Intramuscular Given 09/02/23 1758)    ED Course/ Medical Decision Making/ A&P                                 Medical Decision Making Amount and/or Complexity of Data Reviewed Radiology: ordered.  Risk Prescription drug management.   This patient presents to the ED for concern of low back pain, this involves an extensive number of treatment options, and is a complaint that carries with it a high risk of complications and morbidity.  The differential diagnosis includes sacroiliitis, fracture, strain/pain, dislocation,  ligamentous/tendinous injury, AAA, aortic dissection, cauda equina, spinal epidural abscess, other   Co morbidities that complicate the patient evaluation  See HPI   Additional history obtained:  Additional history obtained from EMR External records from outside source obtained and reviewed including hospital records   Lab Tests:  N/a   Imaging Studies ordered:  I ordered imaging studies including lumbar x-ray, pelvis x-ray with left hip which are pending at shift change   Cardiac Monitoring: / EKG:  The patient was maintained on a cardiac monitor.  I personally viewed and interpreted the cardiac monitored which showed an underlying rhythm of: Sinus rhythm   Consultations Obtained:  N/a   Problem  List / ED Course / Critical interventions / Medication management  Left low back pain I ordered medication including Toradol, Lidoderm  Reevaluation of the patient after these medicines showed that the patient improved I have reviewed the patients home medicines and have made adjustments as needed   Social Determinants of Health:  Some cigarette use.  Denies illicit drug use.   Test / Admission - Considered:  Left low back pain Vitals signs within normal range and stable throughout visit. Laboratory/imaging studies significant for: See above 32 year old male presents emergency department with complaints of left low back pain for the past week or so.  No trauma associated with event.  Patient without affect signs for back pain on PE/HPI; low suspicion for spinal cord compression/cauda equina/spinal epidural abscess.  Patient with reproducible tenderness mainly when palpating left SI joint.  Some paraspinal tenderness on the left lower lumbar region.  Awaiting x-ray imaging results at time of shift change.  Expect discharge with symptomatic therapy and follow-up with primary care if imaging studies reassuring.  At time of shift change, patient care handed off to [facility].  Patient stable upon shift change.        Final Clinical Impression(s) / ED Diagnoses Final diagnoses:  Acute left-sided low back pain without sciatica    Rx / DC Orders ED Discharge Orders          Ordered    ketorolac (TORADOL) 10 MG tablet  Every 6 hours PRN        09/02/23 1854    lidocaine (LIDODERM) 5 %  Every 24 hours        09/02/23 1854    methocarbamol (ROBAXIN) 500 MG tablet  2 times daily PRN        09/02/23 1854              Peter Garter, PA 09/02/23 1856    Elayne Snare K, DO 09/02/23 2340

## 2023-09-02 NOTE — ED Provider Notes (Signed)
  Accepted handoff at shift change from [facility]. Please see prior provider note for more detail.   Briefly: Patient is 32 y.o. "complaints of left low back/hip pain for the past couple of weeks. States that he was sitting at the office when it began. Reports constant sharp pain since onset. States that pain is worsened with movement of his back as well as his left hip. Has taken at home Tylenol without improvement. Denies any fever, bowel/bladder dysfunction, weakness/sensory deficits lower extremities, saddle esthesia, history of IV drug use, prolonged corticosteroid use. Denies any trauma/falls. "  DDX: concern for sacroiliitis, fracture, strain/pain, dislocation, ligamentous/tendinous injury, AAA, aortic dissection, cauda equina, spinal epidural abscess, other   Plan: . Dispo pending imaging - hip and lumbar xray without acute concern. Shared results with patient.  PA Excell Seltzer was able to prescribe patient Toradol, lidocaine patches, and Robaxin.  Patient also with information to set up appointment with orthopedic.  Answered patient's questions. -Patient afebrile with stable vitals.  Provided with return precautions.  Discharged in good condition.   Dorthy Cooler, [place] 09/02/23 2022    Elayne Snare K, DO 09/02/23 2340

## 2024-09-01 ENCOUNTER — Other Ambulatory Visit: Payer: Self-pay

## 2024-09-01 ENCOUNTER — Emergency Department (HOSPITAL_BASED_OUTPATIENT_CLINIC_OR_DEPARTMENT_OTHER)
Admission: EM | Admit: 2024-09-01 | Discharge: 2024-09-01 | Disposition: A | Discharge status: Home / Self Care | Attending: Emergency Medicine | Admitting: Emergency Medicine

## 2024-09-01 ENCOUNTER — Encounter (HOSPITAL_BASED_OUTPATIENT_CLINIC_OR_DEPARTMENT_OTHER): Payer: Self-pay

## 2024-09-01 DIAGNOSIS — R109 Unspecified abdominal pain: Secondary | ICD-10-CM

## 2024-09-01 DIAGNOSIS — R059 Cough, unspecified: Secondary | ICD-10-CM | POA: Diagnosis present

## 2024-09-01 DIAGNOSIS — R030 Elevated blood-pressure reading, without diagnosis of hypertension: Secondary | ICD-10-CM | POA: Insufficient documentation

## 2024-09-01 DIAGNOSIS — J069 Acute upper respiratory infection, unspecified: Secondary | ICD-10-CM | POA: Diagnosis not present

## 2024-09-01 LAB — URINALYSIS, ROUTINE W REFLEX MICROSCOPIC
Bilirubin Urine: NEGATIVE
Glucose, UA: NEGATIVE mg/dL
Hgb urine dipstick: NEGATIVE
Ketones, ur: NEGATIVE mg/dL
Leukocytes,Ua: NEGATIVE
Nitrite: NEGATIVE
Protein, ur: NEGATIVE mg/dL
Specific Gravity, Urine: 1.027 (ref 1.005–1.030)
pH: 6.5 (ref 5.0–8.0)

## 2024-09-01 LAB — RESP PANEL BY RT-PCR (RSV, FLU A&B, COVID)  RVPGX2
Influenza A by PCR: NEGATIVE
Influenza B by PCR: NEGATIVE
Resp Syncytial Virus by PCR: NEGATIVE
SARS Coronavirus 2 by RT PCR: NEGATIVE

## 2024-09-01 LAB — GROUP A STREP BY PCR: Group A Strep by PCR: NOT DETECTED

## 2024-09-01 MED ORDER — ACETAMINOPHEN 500 MG PO TABS
1000.0000 mg | ORAL_TABLET | Freq: Once | ORAL | Status: AC
  Administered 2024-09-01: 1000 mg via ORAL
  Filled 2024-09-01: qty 2

## 2024-09-01 MED ORDER — IBUPROFEN 400 MG PO TABS
400.0000 mg | ORAL_TABLET | Freq: Once | ORAL | Status: AC
  Administered 2024-09-01: 400 mg via ORAL
  Filled 2024-09-01: qty 1

## 2024-09-01 NOTE — ED Provider Notes (Addendum)
 [place] [facility] AT [facility] Provider Note   CSN: [id number] Arrival date & time: 09/01/24  1110     Patient presents with: Sore Throat and Ear Fullness   [place]. is a 33 y.o. male.   Patient c/o sore throat in past couple days, general, diffuse, mild-mod, no severe throat pain or any unilateral pain/swelling. Denies known ill contacts. Mild nasal congestion, and occasional non prod cough. No neck pain/stiffness. Mild body aches.  No trouble breathing or swallowing. Also has noted pain to left flank, anterio-laterally in past couple weeks. No radiation. Not pleuritic. Not progressive or worsening.Denies nausea/vomiting. Having normal bms. No hematuria, dysuria, or hx kidney stone. No known injury or strain to area. Normal appetite. No fever or chills.   The history is provided by the patient and medical records.  Sore Throat Pertinent negatives include no chest pain, no headaches and no shortness of breath.  Ear Fullness Pertinent negatives include no chest pain, no headaches and no shortness of breath.       Prior to Admission medications   Medication Sig Start Date End Date Taking? Authorizing Provider  acetaminophen  (TYLENOL ) 500 MG tablet Take 500-1,000 mg by mouth every 6 (six) hours as needed for mild pain, fever or headache.    [provider]  albuterol  (PROAIR  HFA) 108 (90 Base) MCG/ACT inhaler Inhale 2 puffs into the lungs See admin instructions. Inhale 2 puffs into the lungs every 4-6 hours as needed for shortness of breath or wheezing    [provider]  albuterol  (PROVENTIL ) (2.5 MG/3ML) 0.083% nebulizer solution Take 2.5 mg by nebulization every 6 (six) hours as needed for wheezing or shortness of breath.    [provider]  doxycycline  (VIBRAMYCIN ) 100 MG capsule Take 1 capsule (100 mg total) by mouth 2 (two) times daily. 09/11/21   Patsey Lot, MD  hydrOXYzine  (ATARAX /VISTARIL ) 25 MG tablet Take 1  tablet (25 mg total) by mouth every 6 (six) hours. 01/23/21   Wieters, Hallie C, PA-C  ketorolac  (TORADOL ) 10 MG tablet Take 1 tablet (10 mg total) by mouth every 6 (six) hours as needed. 09/02/23   Silver Fell A, PA  lidocaine  (LIDODERM ) 5 % Place 1 patch onto the skin daily. Remove & Discard patch within 12 hours or as directed by MD 09/02/23   Silver Fell LABOR, PA  methocarbamol  (ROBAXIN ) 500 MG tablet Take 1 tablet (500 mg total) by mouth 2 (two) times daily as needed for muscle spasms. 09/02/23   Silver Fell LABOR, PA  ondansetron  (ZOFRAN  ODT) 4 MG disintegrating tablet Take 1-2 tablets (4-8 mg total) by mouth every 8 (eight) hours as needed for nausea or vomiting. 01/23/21   Wieters, Hallie C, PA-C  ondansetron  (ZOFRAN ) 4 MG tablet Take 1 tablet (4 mg total) by mouth every 6 (six) hours as needed for nausea or vomiting. 09/09/22   Ruthell Lonni FALCON, PA-C  promethazine -dextromethorphan (PROMETHAZINE -DM) 6.25-15 MG/5ML syrup Take 5 mLs by mouth 4 (four) times daily as needed for cough. 01/12/23   Smoot, Lauraine LABOR, PA-C  dicyclomine  (BENTYL ) 20 MG tablet Take 1 tablet (20 mg total) by mouth 2 (two) times daily. Patient not taking: Reported on 04/30/2018 05/19/16 07/13/19  Garrick Charleston, MD  metoCLOPramide  (REGLAN ) 10 MG tablet Take 1 tablet (10 mg total) by mouth 3 (three) times daily as needed for nausea (headache / nausea). Patient not taking: Reported on 04/30/2018 02/06/16 07/13/19  Cleotilde Rogue, MD    Allergies: Flagyl [metronidazole] and  Flagyl [metronidazole]    Review of Systems  Constitutional:  Negative for chills and fever.  HENT:  Positive for congestion, rhinorrhea and sore throat. Negative for ear pain and trouble swallowing.   Eyes:  Negative for pain, discharge and redness.  Respiratory:  Positive for cough. Negative for shortness of breath.   Cardiovascular:  Negative for chest pain and leg swelling.  Gastrointestinal:  Negative for abdominal distention, nausea and vomiting.   Genitourinary:  Negative for dysuria, flank pain, hematuria, scrotal swelling and testicular pain.  Musculoskeletal:  Positive for myalgias. Negative for back pain, neck pain and neck stiffness.  Skin:  Negative for rash.  Neurological:  Negative for headaches.    Updated Vital Signs BP (!) 146/69 (BP Location: Left Arm)   Pulse 82   Temp 98.9 F (37.2 C) (Oral)   Resp 20   Ht 1.778 m (5' 10)   Wt 99.8 kg   SpO2 100%   BMI 31.57 kg/m   Physical Exam Vitals and nursing note reviewed.  Constitutional:      Appearance: Normal appearance. He is well-developed.  HENT:     Head: Atraumatic.     Nose: Congestion present.     Mouth/Throat:     Mouth: Mucous membranes are moist.     Pharynx: Oropharynx is clear. No oropharyngeal exudate.     Comments: Mild erythema. No severe swelling noted. No asymmetric pain/swelling. No trismus.  Eyes:     General: No scleral icterus.       Right eye: No discharge.        Left eye: No discharge.     Conjunctiva/sclera: Conjunctivae normal.     Pupils: Pupils are equal, round, and reactive to light.  Neck:     Trachea: No tracheal deviation.     Comments: Trachea midline, thyroid not enlarged or tender. No neck stiffness or rigidity.  Cardiovascular:     Rate and Rhythm: Normal rate and regular rhythm.     Pulses: Normal pulses.     Heart sounds: Normal heart sounds. No murmur heard.    No friction rub. No gallop.  Pulmonary:     Effort: Pulmonary effort is normal. No accessory muscle usage or respiratory distress.     Breath sounds: Normal breath sounds.  Abdominal:     General: Bowel sounds are normal. There is no distension.     Palpations: Abdomen is soft. There is no mass.     Tenderness: There is no abdominal tenderness. There is no guarding.     Comments: No hsm.   Genitourinary:    Comments: No cva tenderness. Musculoskeletal:        General: No swelling or tenderness.     Cervical back: Normal range of motion and neck  supple. No rigidity.     Right lower leg: No edema.     Left lower leg: No edema.  Skin:    General: Skin is warm and dry.     Findings: No rash.  Neurological:     Mental Status: He is alert.     Comments: Alert, speech clear.   Psychiatric:        Mood and Affect: Mood normal.     (all labs ordered are listed, but only abnormal results are displayed) Results for orders placed or performed during the hospital encounter of 09/01/24  Resp panel by RT-PCR (RSV, Flu A&B, Covid) Anterior Nasal Swab   Collection Time: 09/01/24 11:17 AM   Specimen: Anterior Nasal Swab  Result Value Ref Range   SARS Coronavirus 2 by RT PCR NEGATIVE NEGATIVE   Influenza A by PCR NEGATIVE NEGATIVE   Influenza B by PCR NEGATIVE NEGATIVE   Resp Syncytial Virus by PCR NEGATIVE NEGATIVE  Group A Strep by PCR   Collection Time: 09/01/24 11:17 AM   Specimen: Throat; Sterile Swab  Result Value Ref Range   Group A Strep by PCR NOT DETECTED NOT DETECTED  Urinalysis, Routine w reflex microscopic -Urine, Clean Catch   Collection Time: 09/01/24 11:39 AM  Result Value Ref Range   Color, Urine YELLOW YELLOW   APPearance CLEAR CLEAR   Specific Gravity, Urine 1.027 1.005 - 1.030   pH 6.5 5.0 - 8.0   Glucose, UA NEGATIVE NEGATIVE mg/dL   Hgb urine dipstick NEGATIVE NEGATIVE   Bilirubin Urine NEGATIVE NEGATIVE   Ketones, ur NEGATIVE NEGATIVE mg/dL   Protein, ur NEGATIVE NEGATIVE mg/dL   Nitrite NEGATIVE NEGATIVE   Leukocytes,Ua NEGATIVE NEGATIVE     EKG: None  Radiology: No results found.   Procedures   Medications Ordered in the ED - No data to display                                  Medical Decision Making Problems Addressed: Elevated blood pressure reading: acute illness or injury Left sided abdominal pain: acute illness or injury with systemic symptoms that poses a threat to life or bodily functions Viral URI: acute illness or injury with systemic symptoms that poses a threat to life or  bodily functions  Amount and/or Complexity of Data Reviewed External Data Reviewed: notes. Labs: ordered. Decision-making details documented in ED Course.  Risk OTC drugs. Prescription drug management.   Labs sent.   Reviewed nursing notes and prior charts for additional history.   Labs reviewed/interpreted by me - covid and flu neg. Strep neg.   Given symptoms/labs/eval - felt most c/w viral syndrome.   Recheck pt, no trouble breathing, or swallowing. No distress. Abd soft non tender.  Currently hr 76, rr 14, pulse ox 100%.   Acetaminophen  po, ibuprofen po. Po fluids.   Pt currently appear stable for ED d/c.  Pt requests work note - provided.   Rec close pcp f/u.  Return precautions provided.        Final diagnoses:  Viral URI  Left sided abdominal pain  Elevated blood pressure reading    ED Discharge Orders     None          Bernard Drivers, MD 09/01/24 1304

## 2024-09-01 NOTE — ED Triage Notes (Addendum)
 Arrives ambulatory to the ED with complaints of generalized body pain (pain in the left side of his abdomen), with a sore throat and ear fullness x2 days. Took OTC meds with minima relief.

## 2024-09-01 NOTE — ED Notes (Signed)
 Reviewed AVS/discharge instruction with patient. Time allotted for and all questions answered. Patient is agreeable for d/c and escorted to ed exit by staff.

## 2024-09-01 NOTE — ED Notes (Signed)
Patient given gingerale.  Tolerated well.

## 2024-09-01 NOTE — Discharge Instructions (Addendum)
 It was our pleasure to provide your ER care today - we hope that you feel better.  Drink plenty of fluids/stay well hydrated. Take acetaminophen  or ibuprofen as need.  For recent symptoms, follow up closely with primary care doctor in one week if symptoms fail to improve/resolve.also have your blood pressure rechecked then as it is high today.  Return to ER if worse, new symptoms, high fevers, worsening, severe, or one-sided throat pain, increased trouble breathing, new or worsening or severe abdominal pain, vomiting, chest pain, or other concern.

## 2024-11-13 ENCOUNTER — Encounter (HOSPITAL_BASED_OUTPATIENT_CLINIC_OR_DEPARTMENT_OTHER): Payer: Self-pay

## 2024-11-13 ENCOUNTER — Emergency Department (HOSPITAL_BASED_OUTPATIENT_CLINIC_OR_DEPARTMENT_OTHER)
Admission: EM | Admit: 2024-11-13 | Discharge: 2024-11-13 | Disposition: A | Discharge status: Home / Self Care | Payer: Self-pay | Attending: Emergency Medicine | Admitting: Emergency Medicine

## 2024-11-13 ENCOUNTER — Other Ambulatory Visit: Payer: Self-pay

## 2024-11-13 ENCOUNTER — Emergency Department (HOSPITAL_BASED_OUTPATIENT_CLINIC_OR_DEPARTMENT_OTHER): Payer: Self-pay

## 2024-11-13 DIAGNOSIS — J069 Acute upper respiratory infection, unspecified: Secondary | ICD-10-CM | POA: Insufficient documentation

## 2024-11-13 DIAGNOSIS — R079 Chest pain, unspecified: Secondary | ICD-10-CM

## 2024-11-13 LAB — CBC WITH DIFFERENTIAL/PLATELET
Abs Immature Granulocytes: 0.04 K/uL (ref 0.00–0.07)
Basophils Absolute: 0 K/uL (ref 0.0–0.1)
Basophils Relative: 0 %
Eosinophils Absolute: 0.1 K/uL (ref 0.0–0.5)
Eosinophils Relative: 1 %
HCT: 43.3 % (ref 39.0–52.0)
Hemoglobin: 15.5 g/dL (ref 13.0–17.0)
Immature Granulocytes: 0 %
Lymphocytes Relative: 14 %
Lymphs Abs: 1.5 K/uL (ref 0.7–4.0)
MCH: 30.4 pg (ref 26.0–34.0)
MCHC: 35.8 g/dL (ref 30.0–36.0)
MCV: 84.9 fL (ref 80.0–100.0)
Monocytes Absolute: 1.4 K/uL — ABNORMAL HIGH (ref 0.1–1.0)
Monocytes Relative: 13 %
Neutro Abs: 7.5 K/uL (ref 1.7–7.7)
Neutrophils Relative %: 72 %
Platelets: 218 K/uL (ref 150–400)
RBC: 5.1 MIL/uL (ref 4.22–5.81)
RDW: 13.1 % (ref 11.5–15.5)
WBC: 10.6 K/uL — ABNORMAL HIGH (ref 4.0–10.5)
nRBC: 0 % (ref 0.0–0.2)

## 2024-11-13 LAB — COMPREHENSIVE METABOLIC PANEL WITH GFR
ALT: 22 U/L (ref 0–44)
AST: 19 U/L (ref 15–41)
Albumin: 4.6 g/dL (ref 3.5–5.0)
Alkaline Phosphatase: 58 U/L (ref 38–126)
Anion gap: 13 (ref 5–15)
BUN: 11 mg/dL (ref 6–20)
CO2: 26 mmol/L (ref 22–32)
Calcium: 9.2 mg/dL (ref 8.9–10.3)
Chloride: 100 mmol/L (ref 98–111)
Creatinine, Ser: 1.04 mg/dL (ref 0.61–1.24)
GFR, Estimated: >60 mL/min
Glucose, Bld: 97 mg/dL (ref 70–99)
Potassium: 3.6 mmol/L (ref 3.5–5.1)
Sodium: 139 mmol/L (ref 135–145)
Total Bilirubin: 0.7 mg/dL (ref 0.0–1.2)
Total Protein: 7.1 g/dL (ref 6.5–8.1)

## 2024-11-13 LAB — D-DIMER, QUANTITATIVE: D-Dimer, Quant: 0.3 ug{FEU}/mL (ref 0.00–0.50)

## 2024-11-13 LAB — TROPONIN T, HIGH SENSITIVITY: Troponin T High Sensitivity: <15 ng/L (ref 0–19)

## 2024-11-13 NOTE — ED Notes (Signed)
"  Xray at bedside.   "

## 2024-11-13 NOTE — ED Notes (Signed)
 ED Provider at bedside.

## 2024-11-13 NOTE — Discharge Instructions (Addendum)
 You were seen in the ER for chest discomfort coughing There is no evidence of heart attack or blood clot in the lungs (PE) Take Tylenol  or Motrin  as directed for fevers and discomfort Keep well-hydrated Your symptoms should be resolving in the next 4 to 5 days Return to the emerged dept for chest pain trouble breathing or other concerns

## 2024-11-13 NOTE — ED Provider Notes (Signed)
 " [place] EMERGENCY DEPARTMENT AT [facility] Provider Note   CSN: [id number] Arrival date & time: 11/13/24  9190     Patient presents with: Cough   Dylan KATHEE Burnette Mickey. is a 34 y.o. male.  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  3 days of ongoing cough congestion body aches rhinorrhea.  Coughing has worsened today along with development of left-sided chest discomfort and associated shortness of breath.  No fevers chills or GI symptoms.  Left-sided chest pain feels similar to prior episodes of PE associated with COVID in 2021.  He is no longer on anticoagulation.     Cough      Prior to Admission medications  Medication Sig Start Date End Date Taking? Authorizing Provider  acetaminophen  (TYLENOL ) 500 MG tablet Take 500-1,000 mg by mouth every 6 (six) hours as needed for mild pain, fever or headache.    [provider]  albuterol  (PROAIR  HFA) 108 (90 Base) MCG/ACT inhaler Inhale 2 puffs into the lungs See admin instructions. Inhale 2 puffs into the lungs every 4-6 hours as needed for shortness of breath or wheezing    [provider]  albuterol  (PROVENTIL ) (2.5 MG/3ML) 0.083% nebulizer solution Take 2.5 mg by nebulization every 6 (six) hours as needed for wheezing or shortness of breath.    [provider]  doxycycline  (VIBRAMYCIN ) 100 MG capsule Take 1 capsule (100 mg total) by mouth 2 (two) times daily. 09/11/21   Patsey Lot, MD  hydrOXYzine  (ATARAX /VISTARIL ) 25 MG tablet Take 1 tablet (25 mg total) by mouth every 6 (six) hours. 01/23/21   Wieters, Hallie C, PA-C  ketorolac  (TORADOL ) 10 MG tablet Take 1 tablet (10 mg total) by mouth every 6 (six) hours as needed. 09/02/23   Silver Fell A, PA  lidocaine  (LIDODERM ) 5 % Place 1 patch onto the skin daily. Remove & Discard patch within 12 hours or as directed by MD 09/02/23   Silver Fell LABOR, PA  methocarbamol  (ROBAXIN ) 500 MG tablet Take 1 tablet (500 mg total) by mouth 2  (two) times daily as needed for muscle spasms. 09/02/23   Silver Fell LABOR, PA  ondansetron  (ZOFRAN  ODT) 4 MG disintegrating tablet Take 1-2 tablets (4-8 mg total) by mouth every 8 (eight) hours as needed for nausea or vomiting. 01/23/21   Wieters, Hallie C, PA-C  ondansetron  (ZOFRAN ) 4 MG tablet Take 1 tablet (4 mg total) by mouth every 6 (six) hours as needed for nausea or vomiting. 09/09/22   Ruthell Lonni FALCON, PA-C  promethazine -dextromethorphan (PROMETHAZINE -DM) 6.25-15 MG/5ML syrup Take 5 mLs by mouth 4 (four) times daily as needed for cough. 01/12/23   Smoot, Lauraine LABOR, PA-C  dicyclomine  (BENTYL ) 20 MG tablet Take 1 tablet (20 mg total) by mouth 2 (two) times daily. Patient not taking: Reported on 04/30/2018 05/19/16 07/13/19  Garrick Charleston, MD  metoCLOPramide  (REGLAN ) 10 MG tablet Take 1 tablet (10 mg total) by mouth 3 (three) times daily as needed for nausea (headache / nausea). Patient not taking: Reported on 04/30/2018 02/06/16 07/13/19  Cleotilde Rogue, MD    Allergies: Flagyl [metronidazole] and Flagyl [metronidazole]    Review of Systems  Respiratory:  Positive for cough.     Updated Vital Signs BP (!) 135/91 (BP Location: Left Arm)   Pulse 92   Temp 98.2 F (36.8 C)   Resp 19   Wt 99.8 kg   SpO2 94%   BMI 31.57 kg/m   Physical Exam Vitals and  nursing note reviewed.  HENT:     Head: Normocephalic and atraumatic.  Eyes:     Pupils: Pupils are equal, round, and reactive to light.  Cardiovascular:     Rate and Rhythm: Normal rate and regular rhythm.  Pulmonary:     Effort: Pulmonary effort is normal.     Breath sounds: Normal breath sounds.  Abdominal:     Palpations: Abdomen is soft.     Tenderness: There is no abdominal tenderness.  Skin:    General: Skin is warm and dry.  Neurological:     Mental Status: He is alert.  Psychiatric:        Mood and Affect: Mood normal.     (all labs ordered are listed, but only abnormal results are displayed) Labs Reviewed  CBC  WITH DIFFERENTIAL/PLATELET - Abnormal; Notable for the following components:      Result Value   WBC 10.6 (*)    Monocytes Absolute 1.4 (*)    All other components within normal limits  D-DIMER, QUANTITATIVE  COMPREHENSIVE METABOLIC PANEL WITH GFR  TROPONIN T, HIGH SENSITIVITY    EKG: EKG Interpretation Date/Time:  Tuesday November 13 2024 09:20:51 EST Ventricular Rate:  81 PR Interval:  139 QRS Duration:  82 QT Interval:  343 QTC Calculation: 399 R Axis:   54  Text Interpretation: Sinus rhythm Confirmed by Pamella Sharper [phone]) on 11/13/2024 10:01:26 AM  Radiology: DG Chest Portable 1 View Result Date: 11/13/2024 CLINICAL DATA:  Left-sided chest pain and cough 3 days. EXAM: PORTABLE CHEST 1 VIEW COMPARISON:  01/17/2023 FINDINGS: The heart size and mediastinal contours are within normal limits. Both lungs are clear. The visualized skeletal structures are unremarkable. IMPRESSION: No active disease. Electronically Signed   By: Toribio Agreste M.D.   On: 11/13/2024 09:27     Procedures   Medications Ordered in the ED - No data to display  Clinical Course as of 11/13/24 1010  Tue Nov 13, 2024  1009 D-dimer high sensitive troponin negative EKG without ischemic changes remainder of laboratory workup unremarkable chest x-ray looks clear.  Counseled patient on symptomatic management of likely viral illness.  Appropriate return precautions were discussed in detail [MP]    Clinical Course User Index [MP] Pamella Sharper LABOR, DO                                 Medical Decision Making 34 year old male with history as above presented to the ED for cough congestion left-sided chest pain shortness of breath.  Afebrile normotensive.  No adventitious lung sounds or appreciable murmur.  Oxygen in mid 90s on room air.  Most likely etiology would be viral respiratory illness however need to evaluate for pneumonia.  Left-sided chest pain will evaluate for ACS or dysrhythmia with EKG and high sensitive  troponin.  History of PEs no longer on anticoagulation.  Will start with D-dimer and obtain CTA chest to rule out PE if D-dimer significantly elevated.  Amount and/or Complexity of Data Reviewed Labs: ordered. Radiology: ordered.        Final diagnoses:  Chest pain, unspecified type  Viral URI with cough    ED Discharge Orders     None          Pamella Sharper LABOR, DO 11/13/24 1010  "

## 2024-11-13 NOTE — ED Triage Notes (Signed)
 Presents to ED with cough x3 days. Worsened yesterday with productive cough and brown phlegm. Now with intermittent L sided pain. No meds PTA
